# Patient Record
Sex: Male | Born: 1965 | Race: White | Hispanic: No | Marital: Married | Smoking: Never smoker
Health system: Southern US, Community
[De-identification: ages and names within clinical notes are randomized; demographics above are authoritative.]

## PROBLEM LIST (undated history)

## (undated) DIAGNOSIS — T63301A Toxic effect of unspecified spider venom, accidental (unintentional), initial encounter: Secondary | ICD-10-CM

## (undated) HISTORY — DX: Toxic effect of unspecified spider venom, accidental (unintentional), initial encounter: T63.301A

## (undated) HISTORY — PX: OTHER SURGICAL HISTORY: SHX169

## (undated) HISTORY — PX: FINGER SURGERY: SHX640

---

## 1998-06-19 ENCOUNTER — Encounter: Admission: RE | Admit: 1998-06-19 | Discharge: 1998-06-19 | Payer: Self-pay | Admitting: Sports Medicine

## 1999-03-08 ENCOUNTER — Encounter: Admission: RE | Admit: 1999-03-08 | Discharge: 1999-03-08 | Payer: Self-pay | Admitting: Sports Medicine

## 1999-05-04 ENCOUNTER — Encounter: Admission: RE | Admit: 1999-05-04 | Discharge: 1999-05-04 | Payer: Self-pay | Admitting: Sports Medicine

## 1999-06-04 ENCOUNTER — Encounter: Admission: RE | Admit: 1999-06-04 | Discharge: 1999-06-04 | Payer: Self-pay | Admitting: Family Medicine

## 1999-07-06 ENCOUNTER — Encounter: Admission: RE | Admit: 1999-07-06 | Discharge: 1999-07-06 | Payer: Self-pay | Admitting: Sports Medicine

## 2004-03-06 ENCOUNTER — Ambulatory Visit (HOSPITAL_COMMUNITY): Admission: RE | Admit: 2004-03-06 | Discharge: 2004-03-06 | Payer: Self-pay | Admitting: Family Medicine

## 2004-03-20 ENCOUNTER — Ambulatory Visit: Payer: Self-pay | Admitting: Sports Medicine

## 2004-05-01 ENCOUNTER — Ambulatory Visit: Payer: Self-pay | Admitting: Sports Medicine

## 2005-07-16 ENCOUNTER — Ambulatory Visit (HOSPITAL_COMMUNITY): Admission: RE | Admit: 2005-07-16 | Discharge: 2005-07-16 | Payer: Self-pay | Admitting: Sports Medicine

## 2006-06-27 ENCOUNTER — Emergency Department (HOSPITAL_COMMUNITY): Admission: EM | Admit: 2006-06-27 | Discharge: 2006-06-27 | Payer: Self-pay | Admitting: Emergency Medicine

## 2007-08-18 IMAGING — CR DG ANKLE COMPLETE 3+V*L*
3 series · 3 of 3 positions shown · non-contrast
Comparison: none

CLINICAL DATA: Ankle injury.  
 LEFT ANKLE - 3 VIEW:

[view not recorded (1 of 3)]
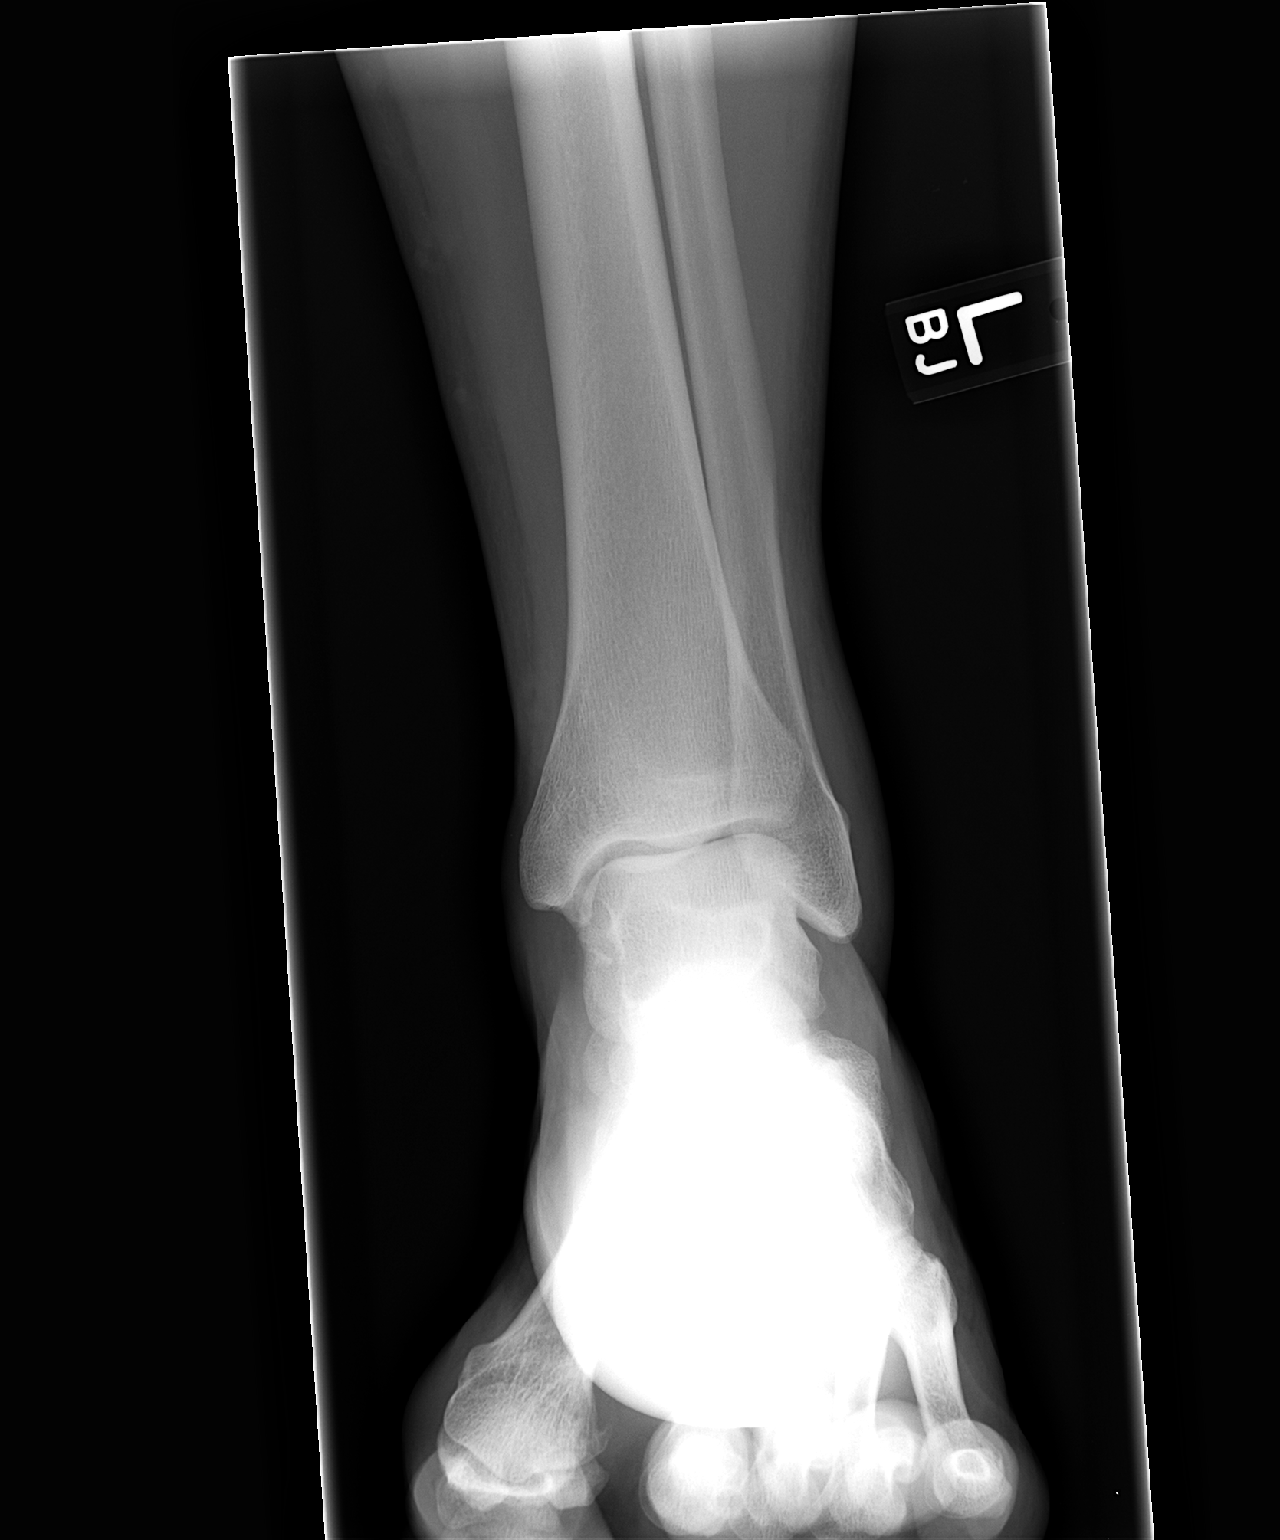

[view not recorded (2 of 3)]
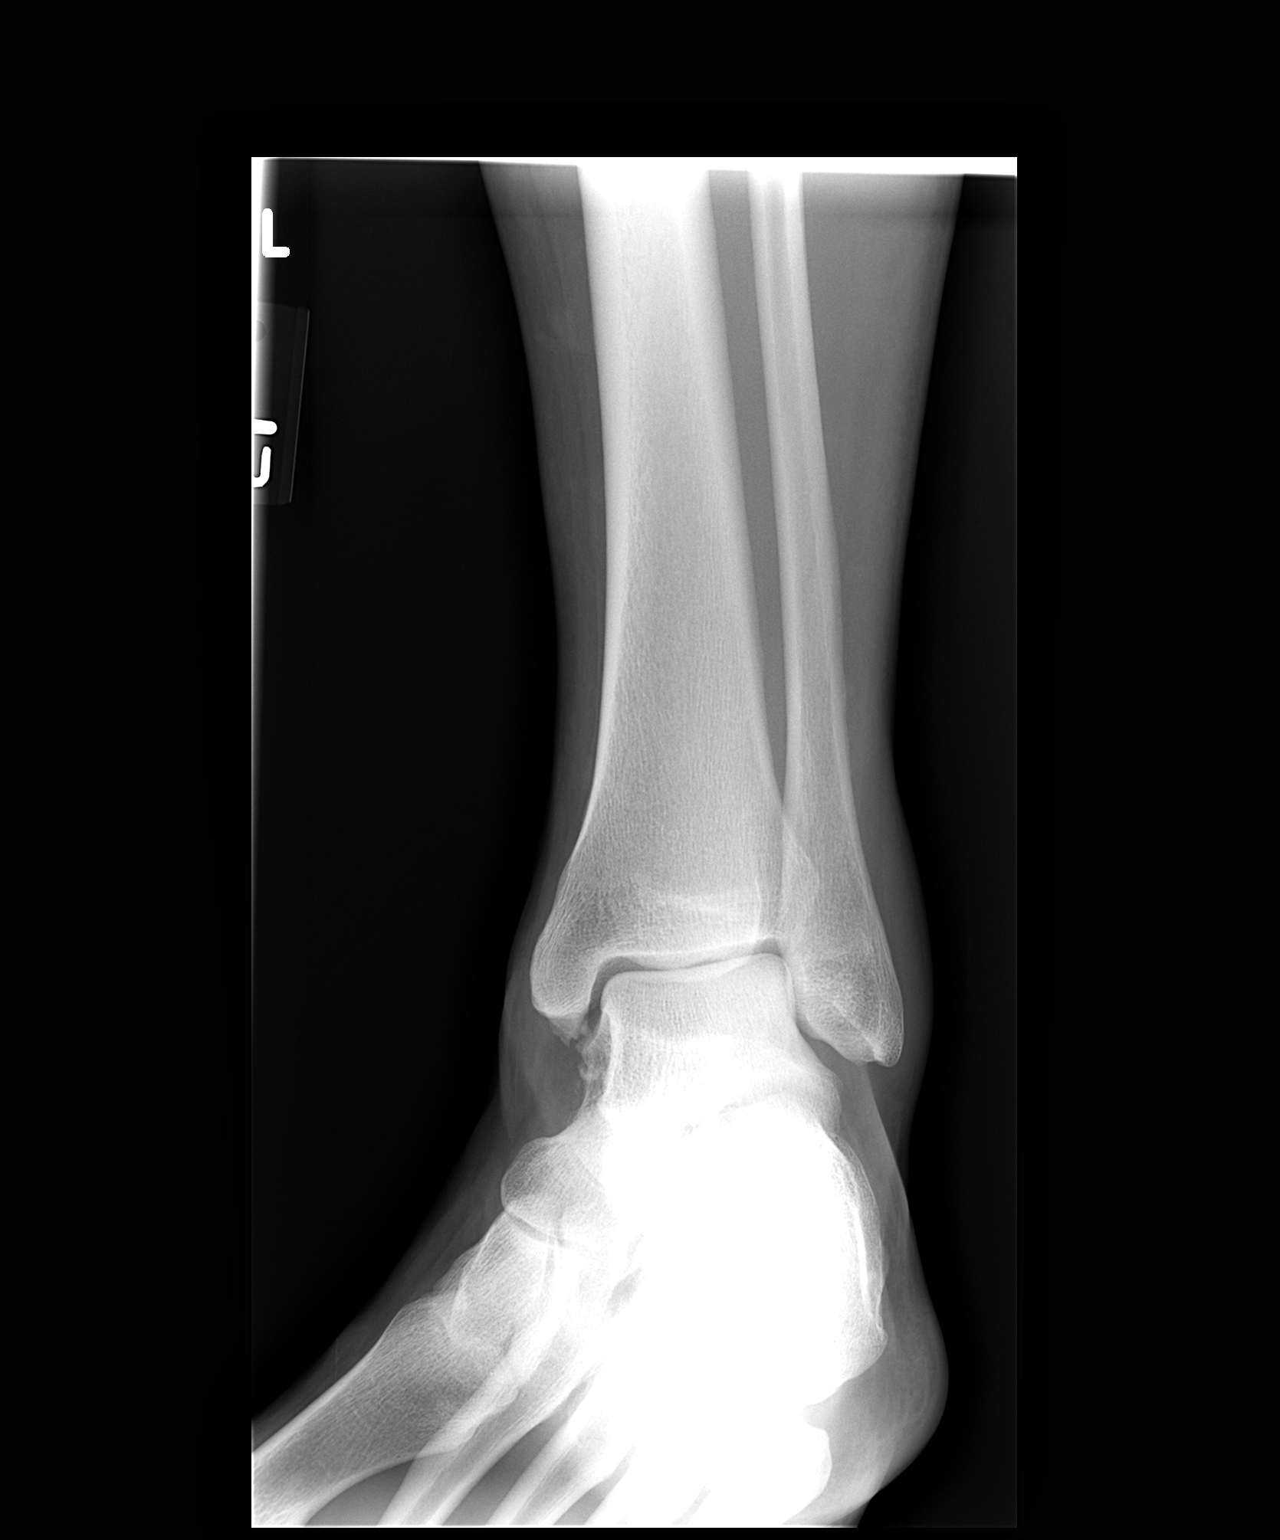

[view not recorded (3 of 3)]
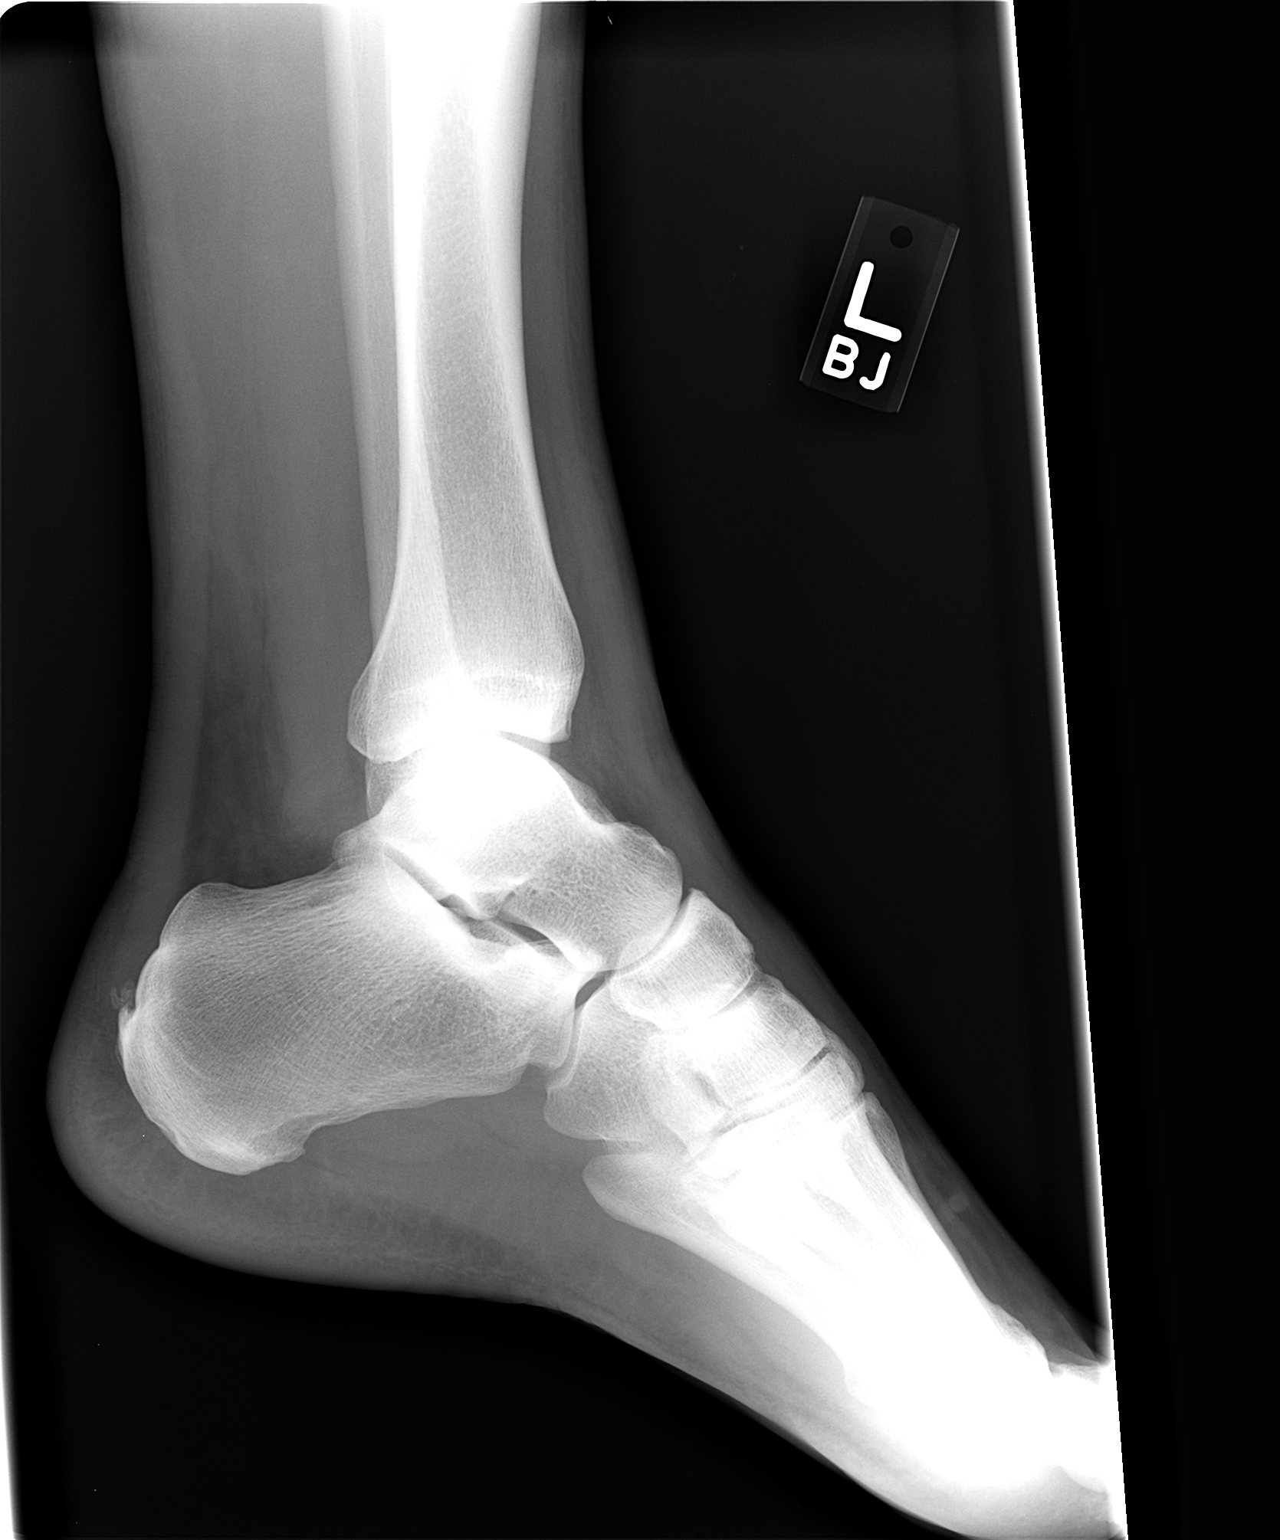

[3 of 3 positions shown; findings below may reference images not displayed]

FINDINGS: Normal alignment; no acute fracture.  There is some irregularity of the medial malleolus which appears to be due to an old fracture.  There is mild lateral soft tissue swelling.  There is early spurring of the calcaneus at the Achilles tendon insertion site.
IMPRESSION: Mild degenerative changes.  No acute abnormality.

## 2011-04-26 ENCOUNTER — Ambulatory Visit (INDEPENDENT_AMBULATORY_CARE_PROVIDER_SITE_OTHER): Payer: 59 | Admitting: Sports Medicine

## 2011-04-26 ENCOUNTER — Ambulatory Visit
Admission: RE | Admit: 2011-04-26 | Discharge: 2011-04-26 | Disposition: A | Discharge status: Home / Self Care | Payer: 59 | Source: Ambulatory Visit | Attending: Family Medicine | Admitting: Family Medicine

## 2011-04-26 VITALS — BP 144/90 | Ht 74.0 in | Wt 215.0 lb

## 2011-04-26 DIAGNOSIS — R1031 Right lower quadrant pain: Secondary | ICD-10-CM

## 2011-04-26 DIAGNOSIS — M76899 Other specified enthesopathies of unspecified lower limb, excluding foot: Secondary | ICD-10-CM

## 2011-04-26 DIAGNOSIS — M6580 Other synovitis and tenosynovitis, unspecified site: Secondary | ICD-10-CM

## 2011-04-26 MED ORDER — NITROGLYCERIN 0.2 MG/HR TD PT24: MEDICATED_PATCH | TRANSDERMAL | Status: DC

## 2011-04-26 NOTE — Progress Notes (Signed)
  Subjective:    Patient ID: Eric Richardson, male    DOB: 11/26/1965, 45 y.o.   MRN: [id number]  HPI Eric Richardson is a 45 yo male patient complaining of pain on the right groin area for the last 2 month after he ran a triathlon in 02/2011. He was able to run a 1/2 marathon last Sunday, he felt the pain after there race. He states that the pain is located in the right groin area at the level of the insertion of the abductor muscle in the pubis rami. The pain is constant, dull ache, 3/10 intensity, no radiated, worse with activities improve by rest. No numbness or tingling.  There is no problem list on file for this patient.  No current outpatient prescriptions on file prior to visit.   Allergies no known allergies   Review of Systems  Constitutional: Negative for chills, diaphoresis and fatigue.  Musculoskeletal: Negative for myalgias, back pain, joint swelling and gait problem.  Neurological: Negative for weakness and numbness.       Objective:   Physical Exam  Constitutional: He is oriented to person, place, and time. He appears well-developed and well-nourished.       BP 144/90  Ht 6\' 2"  (1.88 m)  Wt 215 lb (97.523 kg)  BMI 27.60 kg/m2   Pulmonary/Chest: Effort normal.  Abdominal: Soft. There is no tenderness.  Musculoskeletal:       R hip with FROM . No pian with ROM.  No TTP on the trochanteric bursa. No limping. TTP in the insertion of the abductor muscle in the pubis Sensation intact distally  No pain with resisted abduction   Neurological: He is alert and oriented to person, place, and time.  Skin: Skin is warm. No rash noted. No erythema. No pallor.  Psychiatric: He has a normal mood and affect. His behavior is normal.    MSK U/S : showed a hypoechoic area at the insertion of the abductor muscle in the pubis.       Assessment & Plan:   1. Right groin pain  DG Hip Complete Right, nitroGLYCERIN (NITRODUR - DOSED IN MG/24 HR) 0.2 mg/hr, DISCONTINUED: nitroGLYCERIN  (NITRODUR - DOSED IN MG/24 HR) 0.2 mg/hr  2. Adductor tendonitis     Nitro patch protocol Strengthening exercise for adductor muscle Avoid extreme stretches Cross fit training. F/U in 6 weeks.

## 2011-05-01 ENCOUNTER — Telehealth: Payer: Self-pay | Admitting: *Deleted

## 2011-05-01 NOTE — Telephone Encounter (Signed)
Message copied by Mora Bellman on Wed May 01, 2011 10:05 AM ------      Message from: Enid Baas      Created: Wed May 01, 2011  8:41 AM       Please let Eric Richardson know that his hip xray was normal.  He should stay on the same TX as Dr Ashley Jacobs suggested   tks

## 2011-05-01 NOTE — Telephone Encounter (Signed)
Called patient left VM to return my call.

## 2011-05-01 NOTE — Telephone Encounter (Signed)
Discussed results with pt.

## 2011-06-06 ENCOUNTER — Ambulatory Visit: Payer: 59 | Admitting: Sports Medicine

## 2011-07-04 ENCOUNTER — Ambulatory Visit: Payer: 59 | Admitting: Sports Medicine

## 2012-06-16 IMAGING — CR DG HIP COMPLETE 2+V*R*
2 series · 2 of 2 positions shown · non-contrast
Comparison: None.

CLINICAL DATA: Right groin pain.

RIGHT HIP - COMPLETE 2+ VIEW

[view not recorded (1 of 2)]
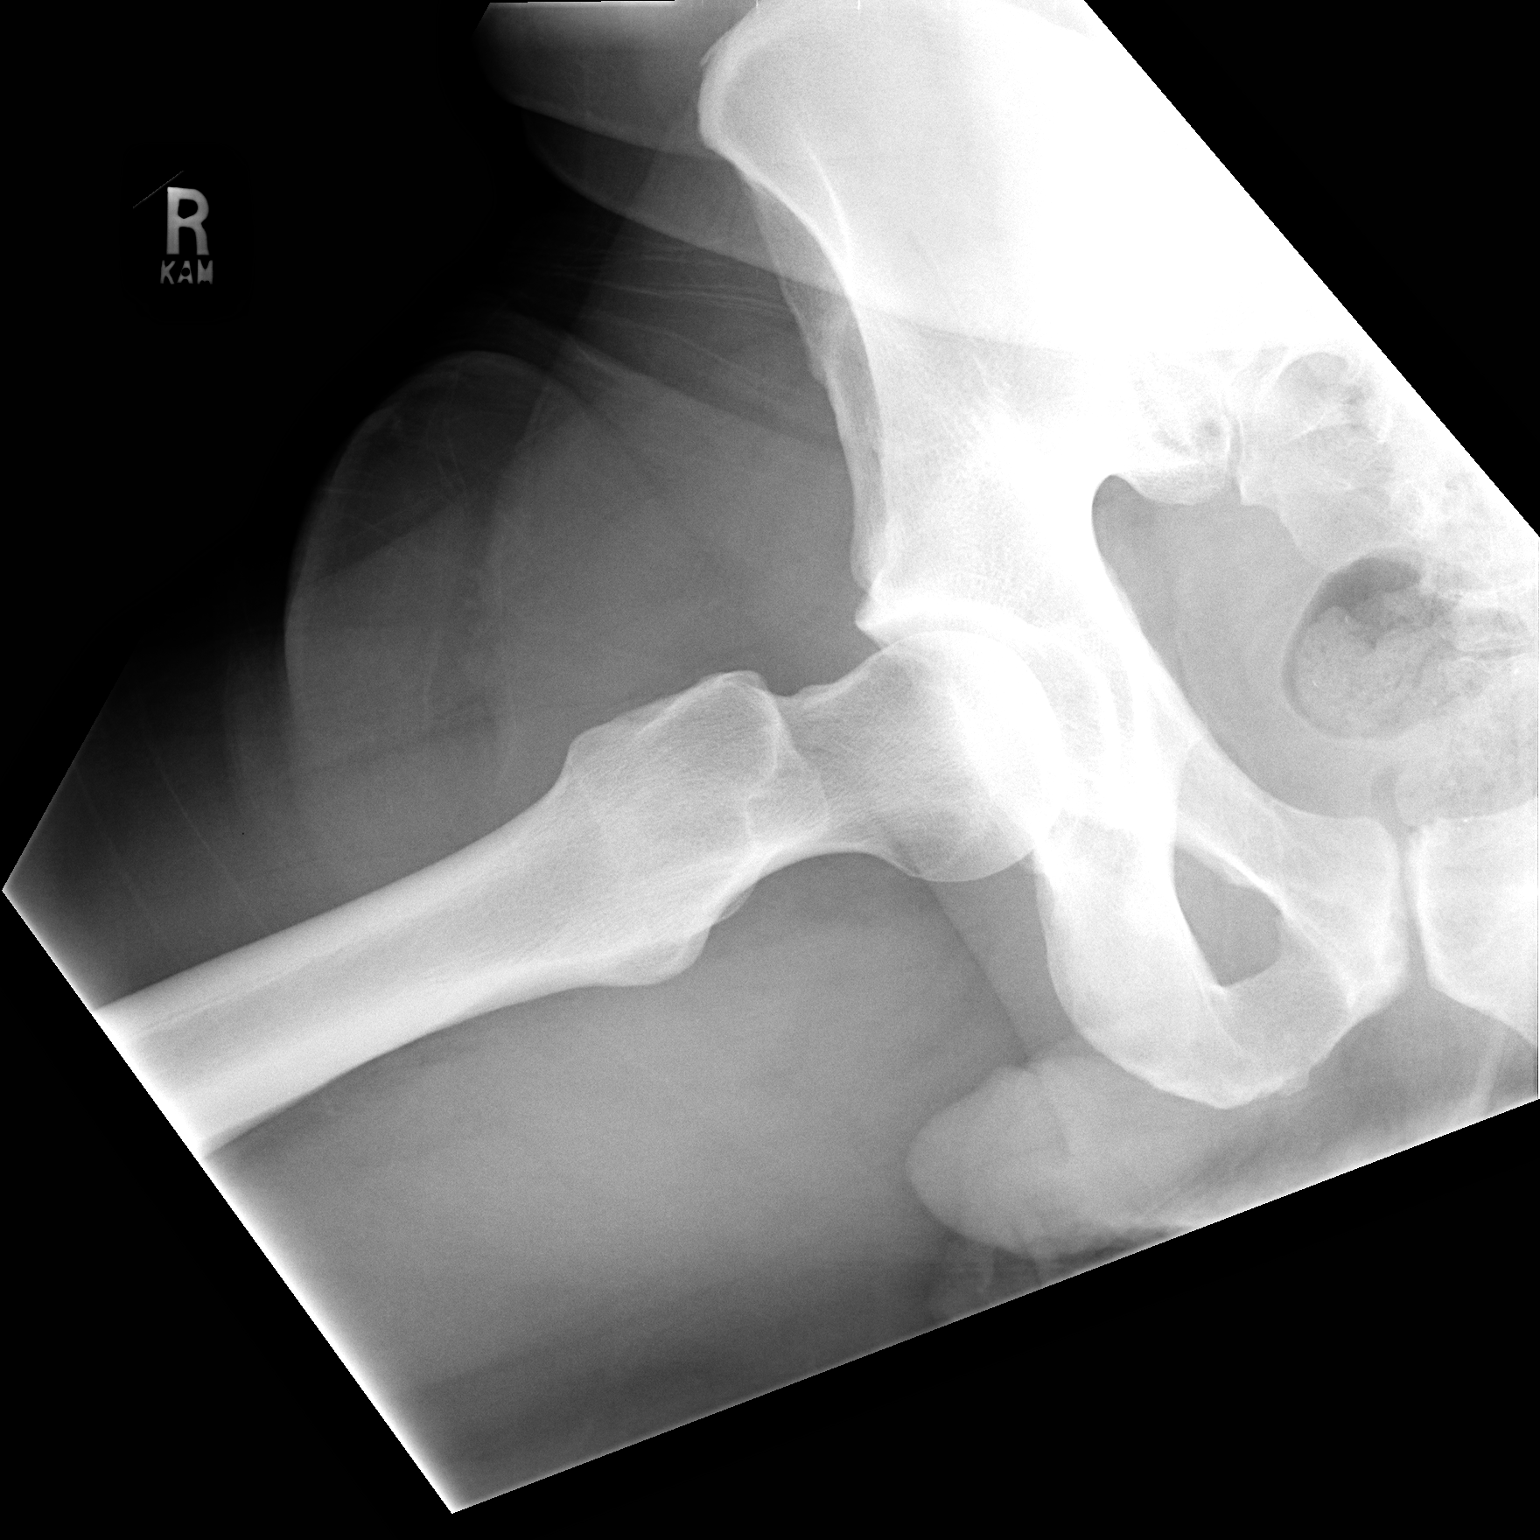

[view not recorded (2 of 2)]
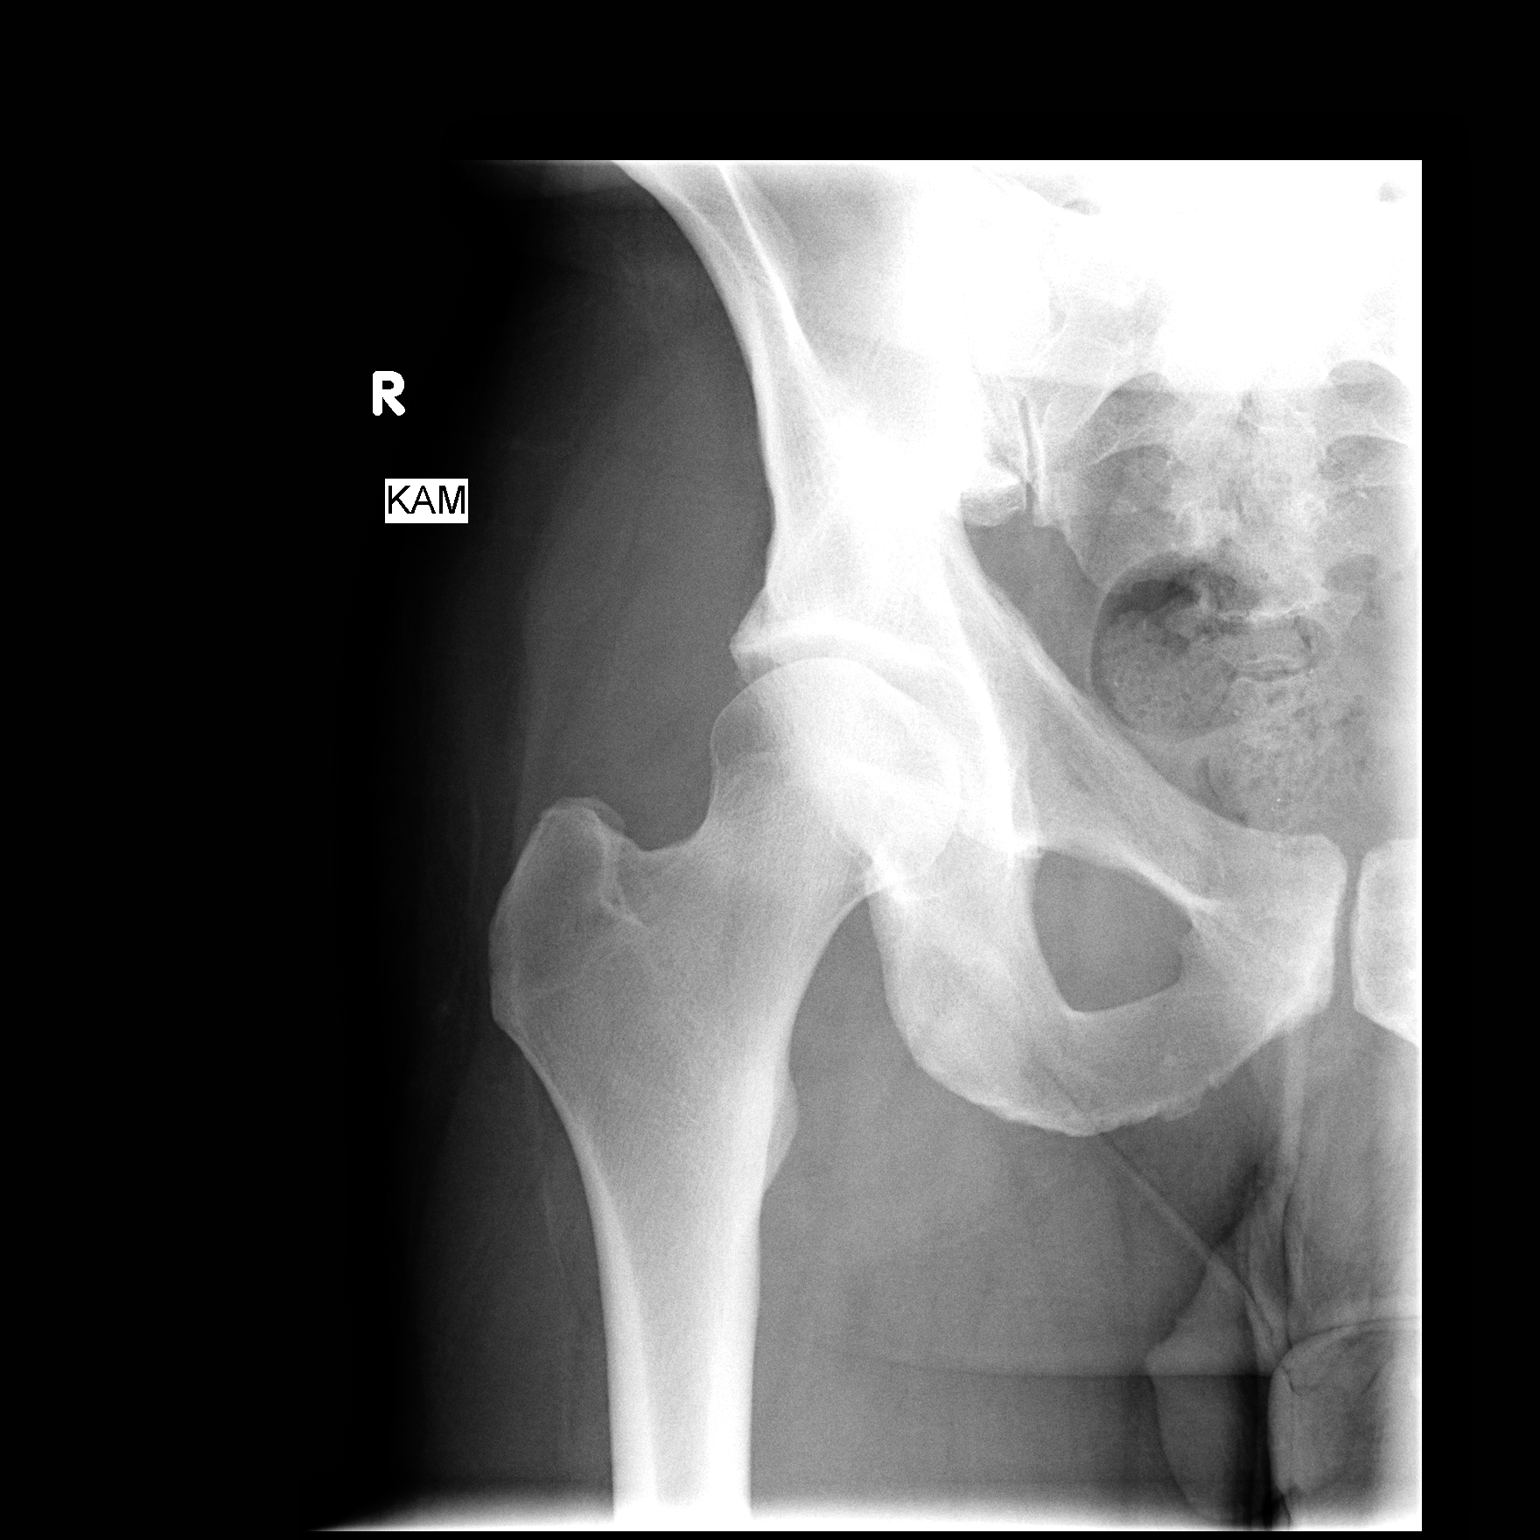

[2 of 2 positions shown; findings below may reference images not displayed]

FINDINGS: No acute bony abnormality.  Specifically, no fracture,
subluxation, or dislocation.  Soft tissues are intact.  Joint space
is maintained.  Right SI joint is unremarkable.
IMPRESSION: Normal study.

## 2013-07-26 ENCOUNTER — Ambulatory Visit: Payer: Self-pay | Admitting: Family Medicine

## 2013-08-03 ENCOUNTER — Ambulatory Visit: Payer: Self-pay | Admitting: Family Medicine

## 2015-05-10 ENCOUNTER — Telehealth: Payer: Self-pay | Admitting: Family Medicine

## 2015-05-10 DIAGNOSIS — Z79899 Other long term (current) drug therapy: Secondary | ICD-10-CM

## 2015-05-10 DIAGNOSIS — Z125 Encounter for screening for malignant neoplasm of prostate: Secondary | ICD-10-CM

## 2015-05-10 DIAGNOSIS — Z13220 Encounter for screening for lipoid disorders: Secondary | ICD-10-CM

## 2015-05-10 NOTE — Telephone Encounter (Signed)
Pt is needing lab orders sent over for his wellness visit in December. There are no previous labs in epic. Pt is also requesting a PSA.

## 2015-05-10 NOTE — Telephone Encounter (Signed)
Left message on voicemail notifying patient that blood work has been ordered.  

## 2015-05-10 NOTE — Telephone Encounter (Signed)
Lip liv m7 psa 

## 2015-06-07 LAB — PSA: Prostate Specific Ag, Serum: 1.8 ng/mL (ref 0.0–4.0)

## 2015-06-07 LAB — LIPID PANEL
CHOL/HDL RATIO: 5.2 ratio — AB (ref 0.0–5.0)
CHOLESTEROL TOTAL: 172 mg/dL (ref 100–199)
HDL: 33 mg/dL — ABNORMAL LOW (ref >39)
LDL CALC: 89 mg/dL (ref 0–99)
TRIGLYCERIDES: 252 mg/dL — AB (ref 0–149)
VLDL Cholesterol Cal: 50 mg/dL — ABNORMAL HIGH (ref 5–40)

## 2015-06-07 LAB — BASIC METABOLIC PANEL
BUN / CREAT RATIO: 12 (ref 9–20)
BUN: 12 mg/dL (ref 6–24)
CALCIUM: 9.4 mg/dL (ref 8.7–10.2)
CO2: 23 mmol/L (ref 18–29)
Chloride: 100 mmol/L (ref 96–106)
Creatinine, Ser: 0.99 mg/dL (ref 0.76–1.27)
GFR, EST AFRICAN AMERICAN: 103 mL/min/{1.73_m2} (ref >59)
GFR, EST NON AFRICAN AMERICAN: 89 mL/min/{1.73_m2} (ref >59)
Glucose: 114 mg/dL — ABNORMAL HIGH (ref 65–99)
Potassium: 4.5 mmol/L (ref 3.5–5.2)
Sodium: 139 mmol/L (ref 134–144)

## 2015-06-07 LAB — HEPATIC FUNCTION PANEL
ALBUMIN: 4.2 g/dL (ref 3.5–5.5)
ALT: 29 IU/L (ref 0–44)
AST: 23 IU/L (ref 0–40)
Alkaline Phosphatase: 47 IU/L (ref 39–117)
BILIRUBIN TOTAL: 0.2 mg/dL (ref 0.0–1.2)
BILIRUBIN, DIRECT: 0.08 mg/dL (ref 0.00–0.40)
Total Protein: 6.7 g/dL (ref 6.0–8.5)

## 2015-06-09 ENCOUNTER — Encounter: Payer: Self-pay | Admitting: Family Medicine

## 2015-06-09 ENCOUNTER — Ambulatory Visit (INDEPENDENT_AMBULATORY_CARE_PROVIDER_SITE_OTHER): Payer: BLUE CROSS/BLUE SHIELD | Admitting: Family Medicine

## 2015-06-09 VITALS — BP 148/86 | Ht 73.75 in | Wt 240.0 lb

## 2015-06-09 DIAGNOSIS — Z0000 Encounter for general adult medical examination without abnormal findings: Secondary | ICD-10-CM

## 2015-06-09 DIAGNOSIS — E785 Hyperlipidemia, unspecified: Secondary | ICD-10-CM | POA: Insufficient documentation

## 2015-06-09 DIAGNOSIS — R7301 Impaired fasting glucose: Secondary | ICD-10-CM | POA: Insufficient documentation

## 2015-06-09 DIAGNOSIS — Z0189 Encounter for other specified special examinations: Secondary | ICD-10-CM | POA: Diagnosis not present

## 2015-06-09 NOTE — Progress Notes (Signed)
Subjective:    Patient ID: Eric Richardson, male    DOB: [birth year], 49 y.o.   MRN: [id number]  HPI The patient comes in today for a wellness visit.  BP in aug was good ,  130 over 80  A review of their health history was completed.  A review of medications was also completed.  Any needed refills; not taking any meds  Eating habits: health concscious  Falls/  MVA accidents in past few months: none  Regular exercise: kettlebell, swim  Specialist pt sees on regular basis: none  Preventative health issues were discussed.   Additional concerns: none  Colonoscopy sheet given will turn 50 in February.   Results for orders placed or performed in visit on 05/10/15  Lipid panel  Result Value Ref Range   Cholesterol, Total 172 100 - 199 mg/dL   Triglycerides 696 (H) 0 - 149 mg/dL   HDL 33 (L) >29 mg/dL   VLDL Cholesterol Cal 50 (H) 5 - 40 mg/dL   LDL Calculated 89 0 - 99 mg/dL   Chol/HDL Ratio 5.2 (H) 0.0 - 5.0 ratio units  Hepatic function panel  Result Value Ref Range   Total Protein 6.7 6.0 - 8.5 g/dL   Albumin 4.2 3.5 - 5.5 g/dL   Bilirubin Total 0.2 0.0 - 1.2 mg/dL   Bilirubin, Direct 5.28 0.00 - 0.40 mg/dL   Alkaline Phosphatase 47 39 - 117 IU/L   AST 23 0 - 40 IU/L   ALT 29 0 - 44 IU/L  Basic metabolic panel  Result Value Ref Range   Glucose 114 (H) 65 - 99 mg/dL   BUN 12 6 - 24 mg/dL   Creatinine, Ser 4.13 0.76 - 1.27 mg/dL   GFR calc non Af Amer 89 >59 mL/min/1.73   GFR calc Af Amer 103 >59 mL/min/1.73   BUN/Creatinine Ratio 12 9 - 20   Sodium 139 134 - 144 mmol/L   Potassium 4.5 3.5 - 5.2 mmol/L   Chloride 100 96 - 106 mmol/L   CO2 23 18 - 29 mmol/L   Calcium 9.4 8.7 - 10.2 mg/dL  PSA  Result Value Ref Range   Prostate Specific Ag, Serum 1.8 0.0 - 4.0 ng/mL    Review of Systems  Constitutional: Negative for fever, activity change and appetite change.  HENT: Negative for congestion and rhinorrhea.   Eyes: Negative for discharge.  Respiratory: Negative  for cough and wheezing.   Cardiovascular: Negative for chest pain.  Gastrointestinal: Negative for vomiting, abdominal pain and blood in stool.  Genitourinary: Negative for frequency and difficulty urinating.  Musculoskeletal: Negative for neck pain.  Skin: Negative for rash.  Allergic/Immunologic: Negative for environmental allergies and food allergies.  Neurological: Negative for weakness and headaches.  Psychiatric/Behavioral: Negative for agitation.  All other systems reviewed and are negative.      Objective:   Physical Exam  Constitutional: He appears well-developed and well-nourished.  HENT:  Head: Normocephalic and atraumatic.  Right Ear: External ear normal.  Left Ear: External ear normal.  Nose: Nose normal.  Mouth/Throat: Oropharynx is clear and moist.  Eyes: EOM are normal. Pupils are equal, round, and reactive to light.  Neck: Normal range of motion. Neck supple. No thyromegaly present.  Cardiovascular: Normal rate, regular rhythm and normal heart sounds.   No murmur heard. Pulmonary/Chest: Effort normal and breath sounds normal. No respiratory distress. He has no wheezes.  Abdominal: Soft. Bowel sounds are normal. He exhibits no distension and no mass. There  is no tenderness.  Genitourinary: Penis normal.  Prostate within normal limits  Musculoskeletal: Normal range of motion. He exhibits no edema.  Lymphadenopathy:    He has no cervical adenopathy.  Neurological: He is alert. He exhibits normal muscle tone.  Skin: Skin is warm and dry. No erythema.  Psychiatric: He has a normal mood and affect. His behavior is normal. Judgment normal.  Vitals reviewed.         Assessment & Plan:  Impression 1 wellness exam #2 impaired fasting glucose discussed #3 hyperlipidemia discussed plan diet exercise discussed. No need for meds now. To get colonoscopy is next year. Recommend yearly checkups along with appropriate blood work.

## 2017-12-03 ENCOUNTER — Telehealth: Payer: Self-pay | Admitting: Family Medicine

## 2017-12-03 DIAGNOSIS — R7301 Impaired fasting glucose: Secondary | ICD-10-CM

## 2017-12-03 DIAGNOSIS — E785 Hyperlipidemia, unspecified: Secondary | ICD-10-CM

## 2017-12-03 DIAGNOSIS — Z79899 Other long term (current) drug therapy: Secondary | ICD-10-CM

## 2017-12-03 DIAGNOSIS — Z125 Encounter for screening for malignant neoplasm of prostate: Secondary | ICD-10-CM

## 2017-12-03 NOTE — Telephone Encounter (Signed)
Rep same 

## 2017-12-03 NOTE — Telephone Encounter (Signed)
Orders placed and the pt is aware. 

## 2017-12-03 NOTE — Telephone Encounter (Signed)
Patient needing labs done has wellness 7/16.

## 2017-12-03 NOTE — Telephone Encounter (Signed)
Last labs 06/06/15. Lipid, liver, bmp, psa

## 2017-12-25 LAB — LIPID PANEL
Chol/HDL Ratio: 4.4 ratio (ref 0.0–5.0)
Cholesterol, Total: 170 mg/dL (ref 100–199)
HDL: 39 mg/dL — AB (ref >39)
LDL Calculated: 113 mg/dL — ABNORMAL HIGH (ref 0–99)
TRIGLYCERIDES: 89 mg/dL (ref 0–149)
VLDL Cholesterol Cal: 18 mg/dL (ref 5–40)

## 2017-12-25 LAB — BASIC METABOLIC PANEL
BUN / CREAT RATIO: 9 (ref 9–20)
BUN: 10 mg/dL (ref 6–24)
CO2: 25 mmol/L (ref 20–29)
CREATININE: 1.14 mg/dL (ref 0.76–1.27)
Calcium: 9.4 mg/dL (ref 8.7–10.2)
Chloride: 102 mmol/L (ref 96–106)
GFR calc Af Amer: 85 mL/min/{1.73_m2} (ref >59)
GFR, EST NON AFRICAN AMERICAN: 74 mL/min/{1.73_m2} (ref >59)
GLUCOSE: 110 mg/dL — AB (ref 65–99)
Potassium: 4.8 mmol/L (ref 3.5–5.2)
SODIUM: 141 mmol/L (ref 134–144)

## 2017-12-25 LAB — HEPATIC FUNCTION PANEL
ALK PHOS: 40 IU/L (ref 39–117)
ALT: 25 IU/L (ref 0–44)
AST: 26 IU/L (ref 0–40)
Albumin: 4.3 g/dL (ref 3.5–5.5)
BILIRUBIN, DIRECT: 0.17 mg/dL (ref 0.00–0.40)
Bilirubin Total: 0.5 mg/dL (ref 0.0–1.2)
TOTAL PROTEIN: 7.2 g/dL (ref 6.0–8.5)

## 2017-12-25 LAB — PSA: PROSTATE SPECIFIC AG, SERUM: 0.6 ng/mL (ref 0.0–4.0)

## 2017-12-30 ENCOUNTER — Encounter: Payer: Self-pay | Admitting: Family Medicine

## 2017-12-30 ENCOUNTER — Ambulatory Visit (INDEPENDENT_AMBULATORY_CARE_PROVIDER_SITE_OTHER): Payer: Managed Care, Other (non HMO) | Admitting: Family Medicine

## 2017-12-30 VITALS — BP 144/96 | Ht 74.0 in | Wt 244.0 lb

## 2017-12-30 DIAGNOSIS — Z23 Encounter for immunization: Secondary | ICD-10-CM | POA: Diagnosis not present

## 2017-12-30 DIAGNOSIS — Z1211 Encounter for screening for malignant neoplasm of colon: Secondary | ICD-10-CM | POA: Diagnosis not present

## 2017-12-30 DIAGNOSIS — Z0000 Encounter for general adult medical examination without abnormal findings: Secondary | ICD-10-CM

## 2017-12-30 NOTE — Progress Notes (Signed)
Subjective:    Patient ID: Eric Richardson, male    DOB: [birth year], 52 y.o.   MRN: [id number]  HPI The patient comes in today for a wellness visit.    A review of their health history was completed.  A review of medications was also completed.  Any needed refills; none  Eating habits: pretty good, trying to increase veggies and cut down on sugar  Falls/  MVA accidents in past few months: no  Regular exercise: yes 3-5 times a week  Specialist pt sees on regular basis: no  Preventative health issues were discussed.   Additional concerns: no  Results for orders placed or performed in visit on 12/03/17  Lipid panel  Result Value Ref Range   Cholesterol, Total 170 100 - 199 mg/dL   Triglycerides 89 0 - 149 mg/dL   HDL 39 (L) >82>39 mg/dL   VLDL Cholesterol Cal 18 5 - 40 mg/dL   LDL Calculated 956113 (H) 0 - 99 mg/dL   Chol/HDL Ratio 4.4 0.0 - 5.0 ratio  Hepatic function panel  Result Value Ref Range   Total Protein 7.2 6.0 - 8.5 g/dL   Albumin 4.3 3.5 - 5.5 g/dL   Bilirubin Total 0.5 0.0 - 1.2 mg/dL   Bilirubin, Direct 2.130.17 0.00 - 0.40 mg/dL   Alkaline Phosphatase 40 39 - 117 IU/L   AST 26 0 - 40 IU/L   ALT 25 0 - 44 IU/L  Basic metabolic panel  Result Value Ref Range   Glucose 110 (H) 65 - 99 mg/dL   BUN 10 6 - 24 mg/dL   Creatinine, Ser 0.861.14 0.76 - 1.27 mg/dL   GFR calc non Af Amer 74 >59 mL/min/1.73   GFR calc Af Amer 85 >59 mL/min/1.73   BUN/Creatinine Ratio 9 9 - 20   Sodium 141 134 - 144 mmol/L   Potassium 4.8 3.5 - 5.2 mmol/L   Chloride 102 96 - 106 mmol/L   CO2 25 20 - 29 mmol/L   Calcium 9.4 8.7 - 10.2 mg/dL  PSA  Result Value Ref Range   Prostate Specific Ag, Serum 0.6 0.0 - 4.0 ng/mL   stillk exercising reg  Thought might have had a flare of shingles left lt flank one yr ago       Review of Systems  Constitutional: Negative for activity change, appetite change and fever.  HENT: Negative for congestion and rhinorrhea.   Eyes: Negative for  discharge.  Respiratory: Negative for cough and wheezing.   Cardiovascular: Negative for chest pain.  Gastrointestinal: Negative for abdominal pain, blood in stool and vomiting.  Genitourinary: Negative for difficulty urinating and frequency.  Musculoskeletal: Negative for neck pain.  Skin: Negative for rash.  Allergic/Immunologic: Negative for environmental allergies and food allergies.  Neurological: Negative for weakness and headaches.  Psychiatric/Behavioral: Negative for agitation.  All other systems reviewed and are negative.      Objective:   Physical Exam  Constitutional: He appears well-developed and well-nourished.  HENT:  Head: Normocephalic and atraumatic.  Right Ear: External ear normal.  Left Ear: External ear normal.  Nose: Nose normal.  Mouth/Throat: Oropharynx is clear and moist.  Eyes: Pupils are equal, round, and reactive to light. EOM are normal.  Neck: Normal range of motion. Neck supple. No thyromegaly present.  Cardiovascular: Normal rate, regular rhythm and normal heart sounds.  No murmur heard. Pulmonary/Chest: Effort normal and breath sounds normal. No respiratory distress. He has no wheezes.  Abdominal: Soft. Bowel sounds are  normal. He exhibits no distension and no mass. There is no tenderness.  Genitourinary: Prostate normal and penis normal.  Musculoskeletal: Normal range of motion. He exhibits no edema.  Lymphadenopathy:    He has no cervical adenopathy.  Neurological: He is alert. He exhibits normal muscle tone.  Skin: Skin is warm and dry. No erythema.  Psychiatric: He has a normal mood and affect. His behavior is normal. Judgment normal.          Assessment & Plan:  Impression well adult exam.  Diet discussed.  Exercise discussed.  Vaccines discussed and Tdap recommended.  Also GI referral for colonoscopy encouraged and we will help set up.  Patient's blood work discussed.  He now has prediabetes aspects of this discussed, also mild  hyperlipidemia not enough for medication also discussed follow-up yearly

## 2017-12-30 NOTE — Patient Instructions (Signed)
Results for orders placed or performed in visit on 12/03/17  Lipid panel  Result Value Ref Range   Cholesterol, Total 170 100 - 199 mg/dL   Triglycerides 89 0 - 149 mg/dL   HDL 39 (L) >40>39 mg/dL   VLDL Cholesterol Cal 18 5 - 40 mg/dL   LDL Calculated 981113 (H) 0 - 99 mg/dL   Chol/HDL Ratio 4.4 0.0 - 5.0 ratio  Hepatic function panel  Result Value Ref Range   Total Protein 7.2 6.0 - 8.5 g/dL   Albumin 4.3 3.5 - 5.5 g/dL   Bilirubin Total 0.5 0.0 - 1.2 mg/dL   Bilirubin, Direct 1.910.17 0.00 - 0.40 mg/dL   Alkaline Phosphatase 40 39 - 117 IU/L   AST 26 0 - 40 IU/L   ALT 25 0 - 44 IU/L  Basic metabolic panel  Result Value Ref Range   Glucose 110 (H) 65 - 99 mg/dL   BUN 10 6 - 24 mg/dL   Creatinine, Ser 4.781.14 0.76 - 1.27 mg/dL   GFR calc non Af Amer 74 >59 mL/min/1.73   GFR calc Af Amer 85 >59 mL/min/1.73   BUN/Creatinine Ratio 9 9 - 20   Sodium 141 134 - 144 mmol/L   Potassium 4.8 3.5 - 5.2 mmol/L   Chloride 102 96 - 106 mmol/L   CO2 25 20 - 29 mmol/L   Calcium 9.4 8.7 - 10.2 mg/dL  PSA  Result Value Ref Range   Prostate Specific Ag, Serum 0.6 0.0 - 4.0 ng/mL

## 2018-01-05 ENCOUNTER — Encounter (INDEPENDENT_AMBULATORY_CARE_PROVIDER_SITE_OTHER): Payer: Self-pay

## 2018-01-05 ENCOUNTER — Encounter: Payer: Self-pay | Admitting: Family Medicine

## 2018-01-05 ENCOUNTER — Encounter: Payer: Self-pay | Admitting: Gastroenterology

## 2018-02-18 ENCOUNTER — Other Ambulatory Visit: Payer: Self-pay

## 2018-02-18 ENCOUNTER — Ambulatory Visit (AMBULATORY_SURGERY_CENTER): Payer: Self-pay | Admitting: *Deleted

## 2018-02-18 VITALS — Ht 74.0 in | Wt 246.0 lb

## 2018-02-18 DIAGNOSIS — Z1211 Encounter for screening for malignant neoplasm of colon: Secondary | ICD-10-CM

## 2018-02-18 MED ORDER — SOD PICOSULFATE-MAG OX-CIT ACD 10-3.5-12 MG-GM -GM/160ML PO SOLN: 1.0000 | ORAL | 0 refills | Status: DC

## 2018-02-18 NOTE — Progress Notes (Signed)
No egg or soy allergy known to patient  No issues with past sedation with any surgeries  or procedures, no intubation problems  No diet pills per patient No home 02 use per patient  No blood thinners per patient  Pt denies issues with constipation  No A fib or A flutter  EMMI video sent to pt's e mail  

## 2018-02-24 ENCOUNTER — Encounter: Payer: Self-pay | Admitting: Gastroenterology

## 2018-03-09 ENCOUNTER — Encounter: Payer: Self-pay | Admitting: Gastroenterology

## 2018-03-09 ENCOUNTER — Other Ambulatory Visit: Payer: Self-pay

## 2018-03-09 ENCOUNTER — Ambulatory Visit (AMBULATORY_SURGERY_CENTER): Payer: Managed Care, Other (non HMO) | Admitting: Gastroenterology

## 2018-03-09 VITALS — BP 129/75 | HR 63 | Temp 99.1°F | Resp 20 | Ht 74.0 in | Wt 244.0 lb

## 2018-03-09 DIAGNOSIS — Z1211 Encounter for screening for malignant neoplasm of colon: Secondary | ICD-10-CM

## 2018-03-09 MED ORDER — SODIUM CHLORIDE 0.9 % IV SOLN: 500.0000 mL | Freq: Once | INTRAVENOUS | Status: DC

## 2018-03-09 NOTE — Patient Instructions (Signed)
Impression/Recommendations:  Diverticulosis handout given to patient.  Resume previous diet. Continue present medications.  Repeat colonoscopy in 10 years for screening purposes.  Return to GI clinic as needed.  YOU HAD AN ENDOSCOPIC PROCEDURE TODAY AT THE [facility]:   Refer to the procedure report that was given to you for any specific questions about what was found during the examination.  If the procedure report does not answer your questions, please call your gastroenterologist to clarify.  If you requested that your care partner not be given the details of your procedure findings, then the procedure report has been included in a sealed envelope for you to review at your convenience later.  YOU SHOULD EXPECT: Some feelings of bloating in the abdomen. Passage of more gas than usual.  Walking can help get rid of the air that was put into your GI tract during the procedure and reduce the bloating. If you had a lower endoscopy (such as a colonoscopy or flexible sigmoidoscopy) you may notice spotting of blood in your stool or on the toilet paper. If you underwent a bowel prep for your procedure, you may not have a normal bowel movement for a few days.  Please Note:  You might notice some irritation and congestion in your nose or some drainage.  This is from the oxygen used during your procedure.  There is no need for concern and it should clear up in a day or so.  SYMPTOMS TO REPORT IMMEDIATELY:   Following lower endoscopy (colonoscopy or flexible sigmoidoscopy):  Excessive amounts of blood in the stool  Significant tenderness or worsening of abdominal pains  Swelling of the abdomen that is new, acute  Fever of 100F or higher  For urgent or emergent issues, a gastroenterologist can be reached at any hour by calling [phone].   DIET:  We do recommend a small meal at first, but then you may proceed to your regular diet.  Drink plenty of fluids but you should avoid  alcoholic beverages for 24 hours.  ACTIVITY:  You should plan to take it easy for the rest of today and you should NOT DRIVE or use heavy machinery until tomorrow (because of the sedation medicines used during the test).    FOLLOW UP: Our staff will call the number listed on your records the next business day following your procedure to check on you and address any questions or concerns that you may have regarding the information given to you following your procedure. If we do not reach you, we will leave a message.  However, if you are feeling well and you are not experiencing any problems, there is no need to return our call.  We will assume that you have returned to your regular daily activities without incident.  If any biopsies were taken you will be contacted by phone or by letter within the next 1-3 weeks.  Please call us at [phone] if you have not heard about the biopsies in 3 weeks.    SIGNATURES/CONFIDENTIALITY: You and/or your care partner have signed paperwork which will be entered into your electronic medical record.  These signatures attest to the fact that that the information above on your After Visit Summary has been reviewed and is understood.  Full responsibility of the confidentiality of this discharge information lies with you and/or your care-partner.

## 2018-03-09 NOTE — Addendum Note (Signed)
Addended by: Vernell BarrierSECHLER, Ziere Docken M on: 03/09/2018 12:20 PM   Modules accepted: Orders

## 2018-03-09 NOTE — Progress Notes (Signed)
Pt's states no medical or surgical changes since previsit or office visit. 

## 2018-03-09 NOTE — Op Note (Signed)
[facility] Patient Name: Oseias Horsey Procedure Date: 03/09/2018 10:30 AM MRN: [id number] Endoscopist: Lynann Bologna , MD Age: 52 Referring MD:  Date of Birth: 1965/10/30 Gender: Male Account #: [id number] Procedure:                Colonoscopy Indications:              Screening for colorectal malignant neoplasm Medicines:                Monitored Anesthesia Care Procedure:                Pre-Anesthesia Assessment:                           - Prior to the procedure, a History and Physical                            was performed, and patient medications and                            allergies were reviewed. The patient's tolerance of                            previous anesthesia was also reviewed. The risks                            and benefits of the procedure and the sedation                            options and risks were discussed with the patient.                            All questions were answered, and informed consent                            was obtained. Prior Anticoagulants: The patient has                            taken no previous anticoagulant or antiplatelet                            agents. ASA Grade Assessment: I - A normal, healthy                            patient. After reviewing the risks and benefits,                            the patient was deemed in satisfactory condition to                            undergo the procedure.                           After obtaining informed consent, the colonoscope  was passed under direct vision. Throughout the                            procedure, the patient's blood pressure, pulse, and                            oxygen saturations were monitored continuously. The                            Colonoscope was introduced through the anus and                            advanced to the 2 cm into the ileum. The                            colonoscopy was performed without  difficulty. The                            patient tolerated the procedure well. The quality                            of the bowel preparation was good. Scope In: 10:34:22 AM Scope Out: 10:48:04 AM Scope Withdrawal Time: 0 hours 9 minutes 58 seconds  Total Procedure Duration: 0 hours 13 minutes 42 seconds  Findings:                 A few small-mouthed diverticula were found in the                            sigmoid colon.                           The exam was otherwise without abnormality on                            direct and retroflexion views. Complications:            No immediate complications. Estimated Blood Loss:     Estimated blood loss: none. Impression:               - Minimal sigmoid diverticulosis.                           - Otherwise normal colonoscopy to TI. Recommendation:           - Patient has a contact number available for                            emergencies. The signs and symptoms of potential                            delayed complications were discussed with the                            patient. Return to normal activities tomorrow.  Written discharge instructions were provided to the                            patient.                           - Resume previous diet.                           - Continue present medications.                           - Repeat colonoscopy in 10 years for screening                            purposes. Earlier, if with any new problems or any                            change in family history.                           - Return to GI clinic PRN. Lynann Bolognaajesh Hari Casaus, MD 03/09/2018 10:52:25 AM This report has been signed electronically.

## 2018-03-09 NOTE — Progress Notes (Signed)
Report given to PACU, vss 

## 2018-03-10 ENCOUNTER — Telehealth: Payer: Self-pay | Admitting: *Deleted

## 2018-03-10 NOTE — Telephone Encounter (Signed)
  Follow up Call-  Call back number 03/09/2018  Post procedure Call Back phone  # [phone]  Permission to leave phone message Yes  Some recent data might be hidden     Patient questions:  Do you have a fever, pain , or abdominal swelling? No. Pain Score  0 *  Have you tolerated food without any problems? Yes.    Have you been able to return to your normal activities? Yes.    Do you have any questions about your discharge instructions: Diet   No. Medications  No. Follow up visit  No.  Do you have questions or concerns about your Care? No.  Actions: * If pain score is 4 or above: No action needed, pain <4.

## 2019-05-28 ENCOUNTER — Ambulatory Visit (INDEPENDENT_AMBULATORY_CARE_PROVIDER_SITE_OTHER): Payer: Managed Care, Other (non HMO) | Admitting: Family Medicine

## 2019-05-28 ENCOUNTER — Other Ambulatory Visit: Payer: Self-pay

## 2019-05-28 DIAGNOSIS — Z20828 Contact with and (suspected) exposure to other viral communicable diseases: Secondary | ICD-10-CM | POA: Diagnosis not present

## 2019-05-28 DIAGNOSIS — Z20822 Contact with and (suspected) exposure to covid-19: Secondary | ICD-10-CM

## 2019-05-28 NOTE — Progress Notes (Signed)
   Subjective:    Patient ID: Eric Richardson, male    DOB: [birth year], 53 y.o.   MRN: [id number]  HPI Pt was exposed to a positive COVID person yesterday. He was in a meeting with someone that had been in contact with a positive person on Saturday. Pt is not having any symptoms.   Virtual Visit via Telephone Note  I connected with Eric Richardson on 05/28/19 at  3:00 PM EST by telephone and verified that I am speaking with the correct person using two identifiers.  Location: Patient: home Provider: office   I discussed the limitations, risks, security and privacy concerns of performing an evaluation and management service by telephone and the availability of in person appointments. I also discussed with the patient that there may be a patient responsible charge related to this service. The patient expressed understanding and agreed to proceed.   History of Present Illness:    Observations/Objective:   Assessment and Plan:   Follow Up Instructions:    I discussed the assessment and treatment plan with the patient. The patient was provided an opportunity to ask questions and all were answered. The patient agreed with the plan and demonstrated an understanding of the instructions.   The patient was advised to call back or seek an in-person evaluation if the symptoms worsen or if the condition fails to improve as anticipated.  I provided 17 minutes of non-face-to-face time during this encounter.   Vicente Males, LPN   Review of Systems No headache, no major weight loss or weight gain, no chest pain no back pain abdominal pain no change in bowel habits complete ROS otherwise negative     Objective:   Physical Exam  Virtual      Assessment & Plan:  Impression close COVID-19 exposure.  For greater than 15 minutes within close to room.  Warning signs discussed.  Covid testing limitations discussed.  Precautions discussed.  Patient to go get testing Monday at [facility] which will be  day 5 since exposure

## 2019-05-30 ENCOUNTER — Encounter: Payer: Self-pay | Admitting: Family Medicine

## 2019-06-04 ENCOUNTER — Other Ambulatory Visit: Payer: Self-pay

## 2019-07-21 ENCOUNTER — Encounter: Payer: Self-pay | Admitting: Family Medicine

## 2019-08-21 ENCOUNTER — Ambulatory Visit: Payer: Managed Care, Other (non HMO) | Attending: Internal Medicine

## 2019-08-21 DIAGNOSIS — Z23 Encounter for immunization: Secondary | ICD-10-CM | POA: Insufficient documentation

## 2019-08-21 NOTE — Progress Notes (Signed)
   Covid-19 Vaccination Clinic  Name:  Eric Richardson    MRN: [id number] DOB: May 27, 1966  08/21/2019  Mr. Eric Richardson was observed post Covid-19 immunization for 15 minutes without incident. He was provided with Vaccine Information Sheet and instruction to access the V-Safe system.   Mr. Eric Richardson was instructed to call 911 with any severe reactions post vaccine: Marland Kitchen Difficulty breathing  . Swelling of face and throat  . A fast heartbeat  . A bad rash all over body  . Dizziness and weakness   Immunizations Administered    Name Date Dose VIS Date Route   Pfizer COVID-19 Vaccine 08/21/2019  2:30 PM 0.3 mL 05/28/2019 Intramuscular   Manufacturer: [employer], [employer]   Lot: [id number]   NDC: [address]-4

## 2019-09-11 ENCOUNTER — Ambulatory Visit: Payer: Managed Care, Other (non HMO) | Attending: Internal Medicine

## 2019-09-11 DIAGNOSIS — Z23 Encounter for immunization: Secondary | ICD-10-CM

## 2019-09-11 NOTE — Progress Notes (Signed)
   Covid-19 Vaccination Clinic  Name:  Eric Richardson    MRN: [id number] DOB: 1965/11/28  09/11/2019  [name] was observed post Covid-19 immunization for 15 minutes without incident. He was provided with Vaccine Information Sheet and instruction to access the V-Safe system.   [name] was instructed to call 911 with any severe reactions post vaccine: Marland Kitchen Difficulty breathing  . Swelling of face and throat  . A fast heartbeat  . A bad rash all over body  . Dizziness and weakness   Immunizations Administered    Name Date Dose VIS Date Route   Pfizer COVID-19 Vaccine 09/11/2019  3:00 PM 0.3 mL 05/28/2019 Intramuscular   Manufacturer: [employer], [employer]   Lot: [id number]   NDC: [address]-1

## 2019-09-20 ENCOUNTER — Ambulatory Visit: Payer: Managed Care, Other (non HMO)

## 2019-09-21 ENCOUNTER — Ambulatory Visit: Payer: Managed Care, Other (non HMO)

## 2019-12-07 ENCOUNTER — Ambulatory Visit: Payer: Managed Care, Other (non HMO)

## 2019-12-07 ENCOUNTER — Encounter: Payer: Self-pay | Admitting: Orthopaedic Surgery

## 2019-12-07 ENCOUNTER — Other Ambulatory Visit: Payer: Self-pay

## 2019-12-07 ENCOUNTER — Ambulatory Visit (INDEPENDENT_AMBULATORY_CARE_PROVIDER_SITE_OTHER): Payer: Managed Care, Other (non HMO) | Admitting: Orthopaedic Surgery

## 2019-12-07 VITALS — BP 160/98 | HR 63 | Ht 74.0 in | Wt 240.0 lb

## 2019-12-07 DIAGNOSIS — M25561 Pain in right knee: Secondary | ICD-10-CM

## 2019-12-07 NOTE — Progress Notes (Signed)
Subjective:    Patient ID: Eric Richardson, male    DOB: 01-13-1966, 54 y.o.   MRN: [id number]  HPI He has history of right knee pain over the years since an injury in the [place] in 1995.  He was doing wall jumps yesterday and felt pain lateral right knee.  He has had swelling and popping.  At first he could not fully extend but has been able to do so today.  He has no giving way.  He has no popping.  He has no redness.  He has no other joint pains.   Review of Systems  Constitutional: Positive for activity change.  Musculoskeletal: Positive for arthralgias, gait problem and joint swelling.  All other systems reviewed and are negative.  For Review of Systems, all other systems reviewed and are negative.  The following is a summary of the past history medically, past history surgically, known current medicines, social history and family history.  This information is gathered electronically by the computer from prior information and documentation.  I review this each visit and have found including this information at this point in the chart is beneficial and informative.   Past Medical History:  Diagnosis Date   Spider bite     Past Surgical History:  Procedure Laterality Date   FINGER SURGERY     I and D of spider bite      Current Outpatient Medications on File Prior to Visit  Medication Sig Dispense Refill   ibuprofen (ADVIL,MOTRIN) 200 MG tablet Take 200 mg by mouth every 6 (six) hours as needed. (Patient not taking: Reported on 12/07/2019)     No current facility-administered medications on file prior to visit.    Social History   Socioeconomic History   Marital status: Married    Spouse name: Not on file   Number of children: Not on file   Years of education: Not on file   Highest education level: Not on file  Occupational History   Not on file  Tobacco Use   Smoking status: Never Smoker   Smokeless tobacco: Never Used  Vaping Use   Vaping Use: Never  used  Substance and Sexual Activity   Alcohol use: Yes    Alcohol/week: 6.0 standard drinks    Types: 6 Cans of beer per week   Drug use: Never   Sexual activity: Not on file  Other Topics Concern   Not on file  Social History Narrative   Not on file   Social Determinants of Health   Financial Resource Strain:    Difficulty of Paying Living Expenses:   Food Insecurity:    Worried About Charity fundraiser in the Last Year:    Arboriculturist in the Last Year:   Transportation Needs:    Film/video editor (Medical):    Lack of Transportation (Non-Medical):   Physical Activity:    Days of Exercise per Week:    Minutes of Exercise per Session:   Stress:    Feeling of Stress :   Social Connections:    Frequency of Communication with Friends and Family:    Frequency of Social Gatherings with Friends and Family:    Attends Religious Services:    Active Member of Clubs or Organizations:    Attends Archivist Meetings:    Marital Status:   Intimate Partner Violence:    Fear of Current or Ex-Partner:    Emotionally Abused:    Physically Abused:  Sexually Abused:     Family History  Problem Relation Age of Onset   Colon cancer Paternal Uncle    Diabetes Father    Heart disease Father    Colon polyps Neg Hx    Esophageal cancer Neg Hx    Stomach cancer Neg Hx    Rectal cancer Neg Hx     BP (!) 160/98    Pulse 63    Ht 6\' 2"  (1.88 m)    Wt 240 lb (108.9 kg)    BMI 30.81 kg/m   Body mass index is 30.81 kg/m.     Objective:   Physical Exam Vitals and nursing note reviewed.  Constitutional:      Appearance: He is well-developed.  HENT:     Head: Normocephalic and atraumatic.  Eyes:     Conjunctiva/sclera: Conjunctivae normal.     Pupils: Pupils are equal, round, and reactive to light.  Cardiovascular:     Rate and Rhythm: Normal rate and regular rhythm.  Pulmonary:     Effort: Pulmonary effort is normal.    Abdominal:     Palpations: Abdomen is soft.  Musculoskeletal:     Cervical back: Normal range of motion and neck supple.       Legs:  Skin:    General: Skin is warm and dry.  Neurological:     Mental Status: He is alert and oriented to person, place, and time.     Cranial Nerves: No cranial nerve deficit.     Motor: No abnormal muscle tone.     Coordination: Coordination normal.     Deep Tendon Reflexes: Reflexes are normal and symmetric. Reflexes normal.  Psychiatric:        Behavior: Behavior normal.        Thought Content: Thought content normal.        Judgment: Judgment normal.    X-rays were done of the right knee, reported separately.        Assessment & Plan:   Encounter Diagnosis  Name Primary?   Acute pain of right knee Yes   I am concerned about possible meniscus tear laterally as he has had problems extending and the lateral pain.  I will have him take ibuprofen 600 mgm tid  Return in two weeks.  He may need MRI or PT or both.  Call if any problem.  Precautions discussed.   Electronically Signed , MD 6/22/20212:02 PM

## 2019-12-21 ENCOUNTER — Ambulatory Visit: Payer: Managed Care, Other (non HMO) | Admitting: Orthopaedic Surgery

## 2020-03-22 ENCOUNTER — Encounter: Payer: Self-pay | Admitting: Family Medicine

## 2020-03-22 ENCOUNTER — Other Ambulatory Visit: Payer: Self-pay

## 2020-03-22 ENCOUNTER — Telehealth (INDEPENDENT_AMBULATORY_CARE_PROVIDER_SITE_OTHER): Payer: Managed Care, Other (non HMO) | Admitting: Family Medicine

## 2020-03-22 DIAGNOSIS — A09 Infectious gastroenteritis and colitis, unspecified: Secondary | ICD-10-CM | POA: Diagnosis not present

## 2020-03-22 MED ORDER — CIPROFLOXACIN HCL 500 MG PO TABS: 500.0000 mg | ORAL_TABLET | Freq: Two times a day (BID) | ORAL | 0 refills | Status: DC

## 2020-03-22 NOTE — Progress Notes (Signed)
   Patient ID: Eric Richardson, male    DOB: [birth year], 54 y.o.   MRN: [id number]  Virtual Visit via Video Note  I connected with Irven Coe on 03/22/20 at  3:30 PM EDT by a video enabled telemedicine application and verified that I am speaking with the correct person using two identifiers.  Location: Patient: home Provider: home   I discussed the limitations of evaluation and management by telemedicine and the availability of in person appointments. The patient expressed understanding and agreed to proceed. Chief Complaint  Patient presents with  . Diarrhea    for 3 days- now noticing light pink in toilet- thinks he has food poisioning   Subjective:    HPI Pt having some diarrhea, 2 days ago with cramping. Fever yesterday afternoon. 103F, yesterday. And normal temp today. Cramping in abdomen is intermittent. Had whelps on arms and legs. Wife google and thought could be Camplobacter. Just got back from [place] and last week was negative for covid last week. Had covid before and doesn't feel like covid illness. Noticing some "blood" in stool, pink tinge to it.  Has been drinking red powerade. Only eating crackers and toast. Drinking Water. Took benadryl since last night help with itchiness. whelps are improving. Tylenol. Bout of diarrhea today- 7am had 4. No immodium.   Medical History Dequavius has a past medical history of Spider bite.   Outpatient Encounter Medications as of 03/22/2020  Medication Sig  . ciprofloxacin (CIPRO) 500 MG tablet Take 1 tablet (500 mg total) by mouth 2 (two) times daily.  Marland Kitchen ibuprofen (ADVIL,MOTRIN) 200 MG tablet Take 200 mg by mouth every 6 (six) hours as needed. (Patient not taking: Reported on 12/07/2019)   No facility-administered encounter medications on file as of 03/22/2020.     Review of Systems  Constitutional: Negative for chills and fever.  HENT: Negative for congestion, rhinorrhea and sore throat.   Respiratory: Negative for  cough, shortness of breath and wheezing.   Cardiovascular: Negative for chest pain and leg swelling.  Gastrointestinal: Positive for abdominal pain and diarrhea. Negative for nausea and vomiting.  Genitourinary: Negative for dysuria and frequency.  Skin: Positive for rash.  Neurological: Negative for dizziness, weakness and headaches.     Vitals There were no vitals taken for this visit.  Objective:   Physical Exam Video call, NAD.  Assessment and Plan   1. Infectious diarrhea - ciprofloxacin (CIPRO) 500 MG tablet; Take 1 tablet (500 mg total) by mouth 2 (two) times daily.  Dispense: 14 tablet; Refill: 0    Will treat empirically with cipro 500mg  bid for 7 days. immodium or pepto-bismol prn. Increase fluids. If bright red blood or black stools to call back and if more pain/fever in abd then call or rto.  F/u prn.   Follow Up Instructions:    I discussed the assessment and treatment plan with the patient. The patient was provided an opportunity to ask questions and all were answered. The patient agreed with the plan and demonstrated an understanding of the instructions.   The patient was advised to call back or seek an in-person evaluation if the symptoms worsen or if the condition fails to improve as anticipated.  I provided 12 minutes of non-face-to-face time during this encounter.   Rakia Frayne [place], DO

## 2021-02-06 ENCOUNTER — Ambulatory Visit (INDEPENDENT_AMBULATORY_CARE_PROVIDER_SITE_OTHER): Payer: Managed Care, Other (non HMO) | Admitting: Internal Medicine

## 2021-05-08 ENCOUNTER — Ambulatory Visit: Payer: Managed Care, Other (non HMO) | Admitting: Registered Nurse

## 2021-06-25 ENCOUNTER — Encounter: Payer: Self-pay | Admitting: Registered Nurse

## 2021-06-25 ENCOUNTER — Ambulatory Visit (INDEPENDENT_AMBULATORY_CARE_PROVIDER_SITE_OTHER): Payer: Managed Care, Other (non HMO) | Admitting: Registered Nurse

## 2021-06-25 VITALS — BP 161/106 | HR 60 | Temp 98.3°F | Resp 18 | Ht 74.0 in | Wt 225.6 lb

## 2021-06-25 DIAGNOSIS — Z13220 Encounter for screening for lipoid disorders: Secondary | ICD-10-CM | POA: Diagnosis not present

## 2021-06-25 DIAGNOSIS — Z1329 Encounter for screening for other suspected endocrine disorder: Secondary | ICD-10-CM | POA: Diagnosis not present

## 2021-06-25 DIAGNOSIS — Z0000 Encounter for general adult medical examination without abnormal findings: Secondary | ICD-10-CM

## 2021-06-25 DIAGNOSIS — Z13228 Encounter for screening for other metabolic disorders: Secondary | ICD-10-CM

## 2021-06-25 DIAGNOSIS — Z130 Encounter for screening for diseases of the blood and blood-forming organs and certain disorders involving the immune mechanism: Secondary | ICD-10-CM

## 2021-06-25 NOTE — Progress Notes (Signed)
New Patient Office Visit  Subjective:  Patient ID: Eric Richardson, male    DOB: [birth year]  Age: 56 y.o. MRN: [id number]  CC:  Chief Complaint  Patient presents with   New Patient (Initial Visit)    Patient states he is here to establish care and CPE.    HPI Eric Richardson presents for new pt to est care, CPE  No acute concerns  Histories reviewed and updated with patient.    Past Medical History:  Diagnosis Date   Spider bite     Past Surgical History:  Procedure Laterality Date   FINGER SURGERY     I and D of spider bite      Family History  Problem Relation Age of Onset   Colon cancer Paternal Uncle    Diabetes Father    Heart disease Father    Colon polyps Neg Hx    Esophageal cancer Neg Hx    Stomach cancer Neg Hx    Rectal cancer Neg Hx     Social History   Socioeconomic History   Marital status: Married    Spouse name: Not on file   Number of children: Not on file   Years of education: Not on file   Highest education level: Not on file  Occupational History   Not on file  Tobacco Use   Smoking status: Never   Smokeless tobacco: Never  Vaping Use   Vaping Use: Never used  Substance and Sexual Activity   Alcohol use: Yes    Alcohol/week: 6.0 standard drinks    Types: 6 Cans of beer per week   Drug use: Never   Sexual activity: Not Currently  Other Topics Concern   Not on file  Social History Narrative   Not on file   Social Determinants of Health   Financial Resource Strain: Not on file  Food Insecurity: Not on file  Transportation Needs: Not on file  Physical Activity: Not on file  Stress: Not on file  Social Connections: Not on file  Intimate Partner Violence: Not on file    ROS Review of Systems  Constitutional: Negative.   HENT: Negative.    Eyes: Negative.   Respiratory: Negative.    Cardiovascular: Negative.   Gastrointestinal: Negative.   Genitourinary: Negative.   Musculoskeletal: Negative.   Skin: Negative.    Neurological: Negative.   Psychiatric/Behavioral: Negative.    All other systems reviewed and are negative.  Objective:   Today's Vitals: BP (!) 161/106    Pulse 60    Temp 98.3 F (36.8 C) (Temporal)    Resp 18    Ht 6\' 2"  (1.88 m)    Wt 225 lb 9.6 oz (102.3 kg)    SpO2 100%    BMI 28.97 kg/m   Physical Exam Vitals and nursing note reviewed.  Constitutional:      General: He is not in acute distress.    Appearance: Normal appearance. He is obese. He is not ill-appearing, toxic-appearing or diaphoretic.  HENT:     Head: Normocephalic and atraumatic.     Right Ear: Tympanic membrane, ear canal and external ear normal. There is no impacted cerumen.     Left Ear: Tympanic membrane, ear canal and external ear normal. There is no impacted cerumen.     Nose: Nose normal. No congestion or rhinorrhea.     Mouth/Throat:     Mouth: Mucous membranes are moist.     Pharynx: Oropharynx is clear. No oropharyngeal  exudate or posterior oropharyngeal erythema.  Eyes:     General: No scleral icterus.       Right eye: No discharge.        Left eye: No discharge.     Extraocular Movements: Extraocular movements intact.     Conjunctiva/sclera: Conjunctivae normal.     Pupils: Pupils are equal, round, and reactive to light.  Neck:     Vascular: No carotid bruit.  Cardiovascular:     Rate and Rhythm: Normal rate and regular rhythm.     Pulses: Normal pulses.     Heart sounds: Normal heart sounds. No murmur heard.   No friction rub. No gallop.  Pulmonary:     Effort: Pulmonary effort is normal. No respiratory distress.     Breath sounds: Normal breath sounds. No stridor. No wheezing, rhonchi or rales.  Chest:     Chest wall: No tenderness.  Abdominal:     General: Abdomen is flat. Bowel sounds are normal. There is no distension.     Palpations: Abdomen is soft. There is no mass.     Tenderness: There is no abdominal tenderness. There is no right CVA tenderness, left CVA tenderness, guarding or  rebound.     Hernia: No hernia is present.  Musculoskeletal:        General: No swelling, tenderness, deformity or signs of injury. Normal range of motion.     Cervical back: Normal range of motion and neck supple. No rigidity or tenderness.     Right lower leg: No edema.     Left lower leg: No edema.  Lymphadenopathy:     Cervical: No cervical adenopathy.  Skin:    General: Skin is warm and dry.     Capillary Refill: Capillary refill takes less than 2 seconds.     Coloration: Skin is not jaundiced or pale.     Findings: No bruising, erythema, lesion or rash.  Neurological:     General: No focal deficit present.     Mental Status: He is alert and oriented to person, place, and time. Mental status is at baseline.     Cranial Nerves: No cranial nerve deficit.     Sensory: No sensory deficit.     Motor: No weakness.     Coordination: Coordination normal.     Gait: Gait normal.     Deep Tendon Reflexes: Reflexes normal.  Psychiatric:        Mood and Affect: Mood normal.        Behavior: Behavior normal.        Thought Content: Thought content normal.        Judgment: Judgment normal.    Assessment & Plan:   Problem List Items Addressed This Visit   None Visit Diagnoses     Annual physical exam    -  Primary   Screening for endocrine, metabolic and immunity disorder       Relevant Orders   CBC with Differential/Platelet (Completed)   Comprehensive metabolic panel (Completed)   Hemoglobin A1c (Completed)   TSH (Completed)   Lipid screening       Relevant Orders   Lipid panel (Completed)       Outpatient Encounter Medications as of 06/25/2021  Medication Sig   [DISCONTINUED] ciprofloxacin (CIPRO) 500 MG tablet Take 1 tablet (500 mg total) by mouth 2 (two) times daily.   [DISCONTINUED] ibuprofen (ADVIL,MOTRIN) 200 MG tablet Take 200 mg by mouth every 6 (six) hours as needed. (Patient not taking:  Reported on 12/07/2019)   No facility-administered encounter medications on  file as of 06/25/2021.    Follow-up: Return in about 2 weeks (around 07/09/2021) for within 2 weeks for lab only visit. Then 1 year for CPE and labs.   PLAN Exam unremarkable Labs collected. Will follow up with the patient as warranted. Return annually, sooner with concerns Patient encouraged to call clinic with any questions, comments, or concerns.  Janeece Agee, NP

## 2021-06-25 NOTE — Patient Instructions (Signed)
Eric Richardson -  Eric Richardson to meet you  No abnormal findings on exam  Labs can be done here or at [facility] - up to you. If you plan to do these here, we ask that you get on to our "Lab Only" schedule to make sure you don't have a wait when you get here. Eric Richardson takes walk ins.  If labs are normal, see you annually for a physical and labs. Sooner if you need anything.  Thank you  Eric Richardson

## 2021-06-27 ENCOUNTER — Other Ambulatory Visit (INDEPENDENT_AMBULATORY_CARE_PROVIDER_SITE_OTHER): Payer: Managed Care, Other (non HMO)

## 2021-06-27 DIAGNOSIS — Z13228 Encounter for screening for other metabolic disorders: Secondary | ICD-10-CM | POA: Diagnosis not present

## 2021-06-27 DIAGNOSIS — Z1329 Encounter for screening for other suspected endocrine disorder: Secondary | ICD-10-CM

## 2021-06-27 DIAGNOSIS — Z13220 Encounter for screening for lipoid disorders: Secondary | ICD-10-CM

## 2021-06-27 DIAGNOSIS — Z130 Encounter for screening for diseases of the blood and blood-forming organs and certain disorders involving the immune mechanism: Secondary | ICD-10-CM

## 2021-06-27 LAB — COMPREHENSIVE METABOLIC PANEL
ALT: 23 U/L (ref 0–53)
AST: 25 U/L (ref 0–37)
Albumin: 4.6 g/dL (ref 3.5–5.2)
Alkaline Phosphatase: 38 U/L — ABNORMAL LOW (ref 39–117)
BUN: 11 mg/dL (ref 6–23)
CO2: 29 mEq/L (ref 19–32)
Calcium: 9.8 mg/dL (ref 8.4–10.5)
Chloride: 102 mEq/L (ref 96–112)
Creatinine, Ser: 1.09 mg/dL (ref 0.40–1.50)
GFR: 76.2 mL/min (ref >60.00)
Glucose, Bld: 102 mg/dL — ABNORMAL HIGH (ref 70–99)
Potassium: 4.9 mEq/L (ref 3.5–5.1)
Sodium: 138 mEq/L (ref 135–145)
Total Bilirubin: 0.9 mg/dL (ref 0.2–1.2)
Total Protein: 7.7 g/dL (ref 6.0–8.3)

## 2021-06-27 LAB — CBC WITH DIFFERENTIAL/PLATELET
Basophils Absolute: 0 10*3/uL (ref 0.0–0.1)
Basophils Relative: 1 % (ref 0.0–3.0)
Eosinophils Absolute: 0.2 10*3/uL (ref 0.0–0.7)
Eosinophils Relative: 3.5 % (ref 0.0–5.0)
HCT: 46.5 % (ref 39.0–52.0)
Hemoglobin: 15.6 g/dL (ref 13.0–17.0)
Lymphocytes Relative: 29.9 % (ref 12.0–46.0)
Lymphs Abs: 1.4 10*3/uL (ref 0.7–4.0)
MCHC: 33.4 g/dL (ref 30.0–36.0)
MCV: 89.1 fl (ref 78.0–100.0)
Monocytes Absolute: 0.4 10*3/uL (ref 0.1–1.0)
Monocytes Relative: 7.7 % (ref 3.0–12.0)
Neutro Abs: 2.8 10*3/uL (ref 1.4–7.7)
Neutrophils Relative %: 57.9 % (ref 43.0–77.0)
Platelets: 199 10*3/uL (ref 150.0–400.0)
RBC: 5.22 Mil/uL (ref 4.22–5.81)
RDW: 13.7 % (ref 11.5–15.5)
WBC: 4.8 10*3/uL (ref 4.0–10.5)

## 2021-06-27 LAB — LIPID PANEL
Cholesterol: 162 mg/dL (ref 0–200)
HDL: 40.2 mg/dL (ref >39.00)
LDL Cholesterol: 105 mg/dL — ABNORMAL HIGH (ref 0–99)
NonHDL: 122.26
Total CHOL/HDL Ratio: 4
Triglycerides: 85 mg/dL (ref 0.0–149.0)
VLDL: 17 mg/dL (ref 0.0–40.0)

## 2021-06-27 LAB — HEMOGLOBIN A1C: Hgb A1c MFr Bld: 5.2 % (ref 4.6–6.5)

## 2021-06-27 LAB — TSH: TSH: 1.48 u[IU]/mL (ref 0.35–5.50)

## 2021-07-02 ENCOUNTER — Encounter: Payer: Self-pay | Admitting: Registered Nurse

## 2021-07-03 ENCOUNTER — Other Ambulatory Visit (INDEPENDENT_AMBULATORY_CARE_PROVIDER_SITE_OTHER): Payer: Managed Care, Other (non HMO)

## 2021-07-03 ENCOUNTER — Other Ambulatory Visit: Payer: Self-pay | Admitting: Registered Nurse

## 2021-07-03 DIAGNOSIS — Z125 Encounter for screening for malignant neoplasm of prostate: Secondary | ICD-10-CM

## 2021-07-03 NOTE — Telephone Encounter (Signed)
Future order placed. My apologies for oversight  Rich

## 2021-07-04 LAB — PSA: PSA: 0.83 ng/mL (ref 0.10–4.00)

## 2021-09-18 ENCOUNTER — Encounter: Payer: Self-pay | Admitting: Registered Nurse

## 2021-09-20 ENCOUNTER — Other Ambulatory Visit: Payer: Self-pay

## 2021-09-20 DIAGNOSIS — H01139 Eczematous dermatitis of unspecified eye, unspecified eyelid: Secondary | ICD-10-CM

## 2021-09-20 NOTE — Telephone Encounter (Signed)
Referral for Dermatology has been entered. ?

## 2022-01-25 ENCOUNTER — Encounter: Payer: Self-pay | Admitting: Family Medicine

## 2022-01-25 ENCOUNTER — Ambulatory Visit (INDEPENDENT_AMBULATORY_CARE_PROVIDER_SITE_OTHER): Payer: Managed Care, Other (non HMO) | Admitting: Family Medicine

## 2022-01-25 DIAGNOSIS — E785 Hyperlipidemia, unspecified: Secondary | ICD-10-CM

## 2022-01-25 DIAGNOSIS — I10 Essential (primary) hypertension: Secondary | ICD-10-CM | POA: Diagnosis not present

## 2022-01-25 NOTE — Patient Instructions (Signed)
Watch your BP.  Follow up in 6 months.  Take care  Dr. Adriana Simas

## 2022-01-28 DIAGNOSIS — I10 Essential (primary) hypertension: Secondary | ICD-10-CM | POA: Insufficient documentation

## 2022-01-28 NOTE — Assessment & Plan Note (Signed)
Mildly elevated LDL.  We will continue to monitor closely.

## 2022-01-28 NOTE — Assessment & Plan Note (Signed)
Uncontrolled.  Discussed pharmacotherapy.  Patient wants to watch diet carefully and lose weight.  He states that when his weight is down his blood pressure issues resolved.

## 2022-01-28 NOTE — Progress Notes (Signed)
Subjective:  Patient ID: Eric Richardson, male    DOB: April 30, 1966  Age: 56 y.o. MRN: [id number]  CC: Chief Complaint  Patient presents with   Establish Care    Former patient of Dr.Morrow.     HPI:  56 year old male presents to establish care with me.  Patient has hyperlipidemia and hypertension.  Hypertension is uncontrolled.  Patient is not interested in pharmacotherapy.  Eric Richardson states that when Eric Richardson is religious about his diet and gets his weight down that his hypertension resolves.  Hyperlipidemia has been fairly well controlled.  LDL mildly elevated at 105.  No pharmacotherapy at this time.  Eric Richardson prefers to manage things without the use of medication if possible.  Eric Richardson is feeling well.  Eric Richardson has no complaints or other concerns at this time.  Patient Active Problem List   Diagnosis Date Noted   Essential hypertension 01/28/2022   Hyperlipidemia 06/09/2015    Social Hx   Social History   Socioeconomic History   Marital status: Married    Spouse name: Not on file   Number of children: Not on file   Years of education: Not on file   Highest education level: Not on file  Occupational History   Not on file  Tobacco Use   Smoking status: Never   Smokeless tobacco: Never  Vaping Use   Vaping Use: Never used  Substance and Sexual Activity   Alcohol use: Yes    Alcohol/week: 6.0 standard drinks of alcohol    Types: 6 Cans of beer per week   Drug use: Never   Sexual activity: Not Currently  Other Topics Concern   Not on file  Social History Narrative   Not on file   Social Determinants of Health   Financial Resource Strain: Not on file  Food Insecurity: Not on file  Transportation Needs: Not on file  Physical Activity: Not on file  Stress: Not on file  Social Connections: Not on file    Review of Systems  Respiratory: Negative.    Cardiovascular: Negative.     Objective:  BP (!) 162/87   Pulse 83   Temp (!) 97.5 F (36.4 C)   Ht 6' 0.5" (1.842 m)   Wt 226 lb  6.4 oz (102.7 kg)   SpO2 98%   BMI 30.28 kg/m      01/25/2022    3:02 PM 06/25/2021    4:03 PM 12/07/2019    1:27 PM  BP/Weight  Systolic BP 162 161 160  Diastolic BP 87 106 98  Wt. (Lbs) 226.4 225.6 240  BMI 30.28 kg/m2 28.97 kg/m2 30.81 kg/m2    Physical Exam Vitals and nursing note reviewed.  Constitutional:      General: Eric Richardson is not in acute distress.    Appearance: Normal appearance.  HENT:     Head: Normocephalic and atraumatic.  Eyes:     General:        Right eye: No discharge.        Left eye: No discharge.     Conjunctiva/sclera: Conjunctivae normal.  Cardiovascular:     Rate and Rhythm: Normal rate and regular rhythm.  Pulmonary:     Effort: Pulmonary effort is normal.     Breath sounds: Normal breath sounds. No wheezing, rhonchi or rales.  Neurological:     Mental Status: Eric Richardson is alert.  Psychiatric:        Mood and Affect: Mood normal.        Behavior: Behavior normal.  Lab Results  Component Value Date   WBC 4.8 06/27/2021   HGB 15.6 06/27/2021   HCT 46.5 06/27/2021   PLT 199.0 06/27/2021   GLUCOSE 102 (H) 06/27/2021   CHOL 162 06/27/2021   TRIG 85.0 06/27/2021   HDL 40.20 06/27/2021   LDLCALC 105 (H) 06/27/2021   ALT 23 06/27/2021   AST 25 06/27/2021   NA 138 06/27/2021   K 4.9 06/27/2021   CL 102 06/27/2021   CREATININE 1.09 06/27/2021   BUN 11 06/27/2021   CO2 29 06/27/2021   TSH 1.48 06/27/2021   PSA 0.83 07/03/2021   HGBA1C 5.2 06/27/2021     Assessment & Plan:   Problem List Items Addressed This Visit       Cardiovascular and Mediastinum   Essential hypertension    Uncontrolled.  Discussed pharmacotherapy.  Patient wants to watch diet carefully and lose weight.  Eric Richardson states that when his weight is down his blood pressure issues resolved.        Other   Hyperlipidemia    Mildly elevated LDL.  We will continue to monitor closely.      Follow-up:  Return in about 6 months (around 07/28/2022).  Everlene Other DO [facility]  Family Medicine

## 2022-03-05 ENCOUNTER — Encounter: Payer: Self-pay | Admitting: Family Medicine

## 2022-03-06 NOTE — Telephone Encounter (Signed)
Epic immunization record updated

## 2022-06-26 ENCOUNTER — Encounter: Payer: Managed Care, Other (non HMO) | Admitting: Registered Nurse

## 2022-07-29 ENCOUNTER — Ambulatory Visit: Payer: Managed Care, Other (non HMO) | Admitting: Family Medicine

## 2022-08-15 ENCOUNTER — Encounter: Payer: Self-pay | Admitting: Radiology

## 2022-08-27 ENCOUNTER — Ambulatory Visit: Payer: Managed Care, Other (non HMO) | Admitting: Family Medicine

## 2022-09-02 ENCOUNTER — Telehealth: Payer: Self-pay | Admitting: Family Medicine

## 2022-09-02 NOTE — Telephone Encounter (Signed)
Last labs 06/2021:  Lipid, TSH, HgbA1c, CMP, CBC, PSA

## 2022-09-02 NOTE — Telephone Encounter (Signed)
Needing labs done for appointment on 3/26

## 2022-09-03 ENCOUNTER — Other Ambulatory Visit: Payer: Self-pay

## 2022-09-03 DIAGNOSIS — I10 Essential (primary) hypertension: Secondary | ICD-10-CM

## 2022-09-03 DIAGNOSIS — E785 Hyperlipidemia, unspecified: Secondary | ICD-10-CM

## 2022-09-03 DIAGNOSIS — Z125 Encounter for screening for malignant neoplasm of prostate: Secondary | ICD-10-CM

## 2022-09-03 NOTE — Telephone Encounter (Signed)
Patient made aware per provider notes . 

## 2022-09-03 NOTE — Telephone Encounter (Signed)
Cook, Jayce G, DO     CBC, CMP, Lipid, PSA. Thank you

## 2022-09-05 LAB — COMPREHENSIVE METABOLIC PANEL
ALT: 26 IU/L (ref 0–44)
AST: 25 IU/L (ref 0–40)
Albumin/Globulin Ratio: 1.7 (ref 1.2–2.2)
Albumin: 4.7 g/dL (ref 3.8–4.9)
Alkaline Phosphatase: 48 IU/L (ref 44–121)
BUN/Creatinine Ratio: 7 — ABNORMAL LOW (ref 9–20)
BUN: 7 mg/dL (ref 6–24)
Bilirubin Total: 0.6 mg/dL (ref 0.0–1.2)
CO2: 24 mmol/L (ref 20–29)
Calcium: 9.9 mg/dL (ref 8.7–10.2)
Chloride: 101 mmol/L (ref 96–106)
Creatinine, Ser: 0.99 mg/dL (ref 0.76–1.27)
Globulin, Total: 2.8 g/dL (ref 1.5–4.5)
Glucose: 110 mg/dL — ABNORMAL HIGH (ref 70–99)
Potassium: 4.8 mmol/L (ref 3.5–5.2)
Sodium: 139 mmol/L (ref 134–144)
Total Protein: 7.5 g/dL (ref 6.0–8.5)
eGFR: 89 mL/min/{1.73_m2} (ref >59)

## 2022-09-05 LAB — CBC WITH DIFFERENTIAL/PLATELET
Basophils Absolute: 0.1 10*3/uL (ref 0.0–0.2)
Basos: 1 %
EOS (ABSOLUTE): 0.2 10*3/uL (ref 0.0–0.4)
Eos: 3 %
Hematocrit: 47.5 % (ref 37.5–51.0)
Hemoglobin: 15.7 g/dL (ref 13.0–17.7)
Immature Grans (Abs): 0 10*3/uL (ref 0.0–0.1)
Immature Granulocytes: 0 %
Lymphocytes Absolute: 1.5 10*3/uL (ref 0.7–3.1)
Lymphs: 25 %
MCH: 29.2 pg (ref 26.6–33.0)
MCHC: 33.1 g/dL (ref 31.5–35.7)
MCV: 89 fL (ref 79–97)
Monocytes Absolute: 0.5 10*3/uL (ref 0.1–0.9)
Monocytes: 7 %
Neutrophils Absolute: 4 10*3/uL (ref 1.4–7.0)
Neutrophils: 64 %
Platelets: 241 10*3/uL (ref 150–450)
RBC: 5.37 x10E6/uL (ref 4.14–5.80)
RDW: 12.6 % (ref 11.6–15.4)
WBC: 6.3 10*3/uL (ref 3.4–10.8)

## 2022-09-05 LAB — LIPID PANEL
Chol/HDL Ratio: 4.1 ratio (ref 0.0–5.0)
Cholesterol, Total: 193 mg/dL (ref 100–199)
HDL: 47 mg/dL (ref >39)
LDL Chol Calc (NIH): 130 mg/dL — ABNORMAL HIGH (ref 0–99)
Triglycerides: 87 mg/dL (ref 0–149)
VLDL Cholesterol Cal: 16 mg/dL (ref 5–40)

## 2022-09-05 LAB — PSA: Prostate Specific Ag, Serum: 0.8 ng/mL (ref 0.0–4.0)

## 2022-09-10 ENCOUNTER — Ambulatory Visit (INDEPENDENT_AMBULATORY_CARE_PROVIDER_SITE_OTHER): Payer: Managed Care, Other (non HMO) | Admitting: Family Medicine

## 2022-09-10 ENCOUNTER — Encounter: Payer: Self-pay | Admitting: Family Medicine

## 2022-09-10 DIAGNOSIS — E785 Hyperlipidemia, unspecified: Secondary | ICD-10-CM | POA: Diagnosis not present

## 2022-09-10 DIAGNOSIS — I10 Essential (primary) hypertension: Secondary | ICD-10-CM | POA: Diagnosis not present

## 2022-09-10 NOTE — Assessment & Plan Note (Signed)
Uncontrolled. Patient declines pharmacotherapy.  He will continue with dietary lifestyle changes.

## 2022-09-10 NOTE — Progress Notes (Signed)
Subjective:  Patient ID: Eric Richardson, male    DOB: 1966-03-07  Age: 57 y.o. MRN: [id number]  CC: Chief Complaint  Patient presents with   6 month follow up    HPI:  57 year old male with hypertension and hyperlipidemia presents for follow-up.  Patient states that his most recent blood pressure was 126/82 when he donated blood.  Normal, his blood pressure is elevated here and was also elevated on repeat.  Recommended pharmacotherapy.  Patient declines and wants to continue working on dietary changes and weight loss.  Patient's lipids also uncontrolled and increased from prior.  LDL trended up from 105-130. Patient's ASCVD risk score is below. The 10-year ASCVD risk score (Arnett DK, et al., 2019) is: 10%   Values used to calculate the score:     Age: 70 years     Sex: Male     Is Non-Hispanic African American: No     Diabetic: No     Tobacco smoker: No     Systolic Blood Pressure: Q000111Q mmHg     Is BP treated: No     HDL Cholesterol: 47 mg/dL     Total Cholesterol: 193 mg/dL  Patient would like CT coronary calcium screening given his age and risk factors.  He is willing to pay out-of-pocket.  Patient Active Problem List   Diagnosis Date Noted   Essential hypertension 01/28/2022   Hyperlipidemia 06/09/2015    Social Hx   Social History   Socioeconomic History   Marital status: Married    Spouse name: Not on file   Number of children: Not on file   Years of education: Not on file   Highest education level: Master's degree (e.g., MA, MS, MEng, MEd, MSW, MBA)  Occupational History   Not on file  Tobacco Use   Smoking status: Never   Smokeless tobacco: Never  Vaping Use   Vaping Use: Never used  Substance and Sexual Activity   Alcohol use: Yes    Alcohol/week: 6.0 standard drinks of alcohol    Types: 6 Cans of beer per week   Drug use: Never   Sexual activity: Not Currently  Other Topics Concern   Not on file  Social History Narrative   Not on file   Social  Determinants of Health   Financial Resource Strain: Low Risk  (09/06/2022)   Overall Financial Resource Strain (CARDIA)    Difficulty of Paying Living Expenses: Not hard at all  Food Insecurity: No Food Insecurity (09/06/2022)   Hunger Vital Sign    Worried About Running Out of Food in the Last Year: Never true    Ran Out of Food in the Last Year: Never true  Transportation Needs: No Transportation Needs (09/06/2022)   PRAPARE - Hydrologist (Medical): No    Lack of Transportation (Non-Medical): No  Physical Activity: Sufficiently Active (09/06/2022)   Exercise Vital Sign    Days of Exercise per Week: 4 days    Minutes of Exercise per Session: 60 min  Stress: No Stress Concern Present (09/06/2022)   Axis    Feeling of Stress : Only a little  Social Connections: Unknown (09/06/2022)   Social Connection and Isolation Panel [NHANES]    Frequency of Communication with Friends and Family: More than three times a week    Frequency of Social Gatherings with Friends and Family: More than three times a week    Attends Religious  Services: Patient declined    Active Member of Clubs or Organizations: Yes    Attends Music therapist: More than 4 times per year    Marital Status: Married    Review of Systems  Constitutional: Negative.   Respiratory: Negative.    Cardiovascular: Negative.      Objective:  BP (!) 161/98   Pulse 73   Temp 97.7 F (36.5 C)   Ht 6' 0.5" (1.842 m)   Wt 220 lb (99.8 kg)   SpO2 98%   BMI 29.43 kg/m      09/10/2022    2:25 PM 09/10/2022    1:43 PM 01/25/2022    3:02 PM  BP/Weight  Systolic BP Q000111Q Q000111Q 0000000  Diastolic BP 98 98 87  Wt. (Lbs)  220 226.4  BMI  29.43 kg/m2 30.28 kg/m2    Physical Exam Vitals and nursing note reviewed.  Constitutional:      General: He is not in acute distress.    Appearance: Normal appearance.  HENT:     Head:  Normocephalic and atraumatic.  Eyes:     General:        Right eye: No discharge.        Left eye: No discharge.     Conjunctiva/sclera: Conjunctivae normal.  Cardiovascular:     Rate and Rhythm: Normal rate and regular rhythm.  Pulmonary:     Effort: Pulmonary effort is normal.     Breath sounds: Normal breath sounds. No wheezing, rhonchi or rales.  Neurological:     Mental Status: He is alert.  Psychiatric:        Mood and Affect: Mood normal.        Behavior: Behavior normal.     Lab Results  Component Value Date   WBC 6.3 09/04/2022   HGB 15.7 09/04/2022   HCT 47.5 09/04/2022   PLT 241 09/04/2022   GLUCOSE 110 (H) 09/04/2022   CHOL 193 09/04/2022   TRIG 87 09/04/2022   HDL 47 09/04/2022   LDLCALC 130 (H) 09/04/2022   ALT 26 09/04/2022   AST 25 09/04/2022   NA 139 09/04/2022   K 4.8 09/04/2022   CL 101 09/04/2022   CREATININE 0.99 09/04/2022   BUN 7 09/04/2022   CO2 24 09/04/2022   TSH 1.48 06/27/2021   PSA 0.83 07/03/2021   HGBA1C 5.2 06/27/2021     Assessment & Plan:   Problem List Items Addressed This Visit       Cardiovascular and Mediastinum   Essential hypertension    Uncontrolled. Patient declines pharmacotherapy.  He will continue with dietary lifestyle changes.      Relevant Orders   CT CARDIAC SCORING (SELF PAY ONLY)     Other   Hyperlipidemia    Uncontrolled. Patient declines pharmacotherapy.      Relevant Orders   CT CARDIAC SCORING (SELF PAY ONLY)    Follow-up:  Return in about 6 months (around 03/13/2023).  [place]

## 2022-09-10 NOTE — Assessment & Plan Note (Addendum)
Uncontrolled. Patient declines pharmacotherapy.

## 2022-09-10 NOTE — Patient Instructions (Signed)
Continue dietary and lifestyle changes.  Follow up in 6 months.

## 2022-09-12 ENCOUNTER — Telehealth: Payer: Self-pay | Admitting: Family Medicine

## 2022-09-12 NOTE — Telephone Encounter (Signed)
Nurse-patient was returning a call [phone]

## 2022-09-12 NOTE — Telephone Encounter (Signed)
Spoke with patient he confirmed that CT scan was scheduled.

## 2022-11-01 ENCOUNTER — Ambulatory Visit (HOSPITAL_COMMUNITY)
Admission: RE | Admit: 2022-11-01 | Discharge: 2022-11-01 | Disposition: A | Discharge status: Home / Self Care | Payer: Managed Care, Other (non HMO) | Source: Ambulatory Visit | Attending: Family Medicine | Admitting: Family Medicine

## 2022-11-01 DIAGNOSIS — E785 Hyperlipidemia, unspecified: Secondary | ICD-10-CM | POA: Insufficient documentation

## 2022-11-01 DIAGNOSIS — I10 Essential (primary) hypertension: Secondary | ICD-10-CM | POA: Insufficient documentation

## 2022-11-05 ENCOUNTER — Encounter: Payer: Self-pay | Admitting: Family Medicine

## 2022-11-08 ENCOUNTER — Ambulatory Visit (HOSPITAL_BASED_OUTPATIENT_CLINIC_OR_DEPARTMENT_OTHER): Payer: Managed Care, Other (non HMO)

## 2022-11-08 ENCOUNTER — Ambulatory Visit (HOSPITAL_BASED_OUTPATIENT_CLINIC_OR_DEPARTMENT_OTHER): Payer: Managed Care, Other (non HMO) | Admitting: Orthopaedic Surgery

## 2022-11-08 DIAGNOSIS — S838X1A Sprain of other specified parts of right knee, initial encounter: Secondary | ICD-10-CM | POA: Diagnosis not present

## 2022-11-08 DIAGNOSIS — M25561 Pain in right knee: Secondary | ICD-10-CM

## 2022-11-08 NOTE — Progress Notes (Signed)
Chief Complaint: Right knee pain     History of Present Illness:    Eric Richardson is a 57 y.o. male presents today with acute right knee pain for the last month when he specifically had a direct contact injury where his goldendoodle ran into the knee and immediately felt the pain with a pop.  He subsequently had difficulty bearing weight.  This is worse with exercise.  He is very active and enjoys running triathlons.  He has been experiencing swelling for which he has been taking ibuprofen and restricting his activity.    Surgical History:   None  PMH/PSH/Family History/Social History/Meds/Allergies:    Past Medical History:  Diagnosis Date   Spider bite    Past Surgical History:  Procedure Laterality Date   FINGER SURGERY     I and D of spider bite     Social History   Socioeconomic History   Marital status: Married    Spouse name: Not on file   Number of children: Not on file   Years of education: Not on file   Highest education level: Master's degree (e.g., MA, MS, MEng, MEd, MSW, MBA)  Occupational History   Not on file  Tobacco Use   Smoking status: Never   Smokeless tobacco: Never  Vaping Use   Vaping Use: Never used  Substance and Sexual Activity   Alcohol use: Yes    Alcohol/week: 6.0 standard drinks of alcohol    Types: 6 Cans of beer per week   Drug use: Never   Sexual activity: Not Currently  Other Topics Concern   Not on file  Social History Narrative   Not on file   Social Determinants of Health   Financial Resource Strain: Low Risk  (09/06/2022)   Overall Financial Resource Strain (CARDIA)    Difficulty of Paying Living Expenses: Not hard at all  Food Insecurity: No Food Insecurity (09/06/2022)   Hunger Vital Sign    Worried About Running Out of Food in the Last Year: Never true    Ran Out of Food in the Last Year: Never true  Transportation Needs: No Transportation Needs (09/06/2022)   PRAPARE -  Administrator, Civil Service (Medical): No    Lack of Transportation (Non-Medical): No  Physical Activity: Sufficiently Active (09/06/2022)   Exercise Vital Sign    Days of Exercise per Week: 4 days    Minutes of Exercise per Session: 60 min  Stress: No Stress Concern Present (09/06/2022)   [employer] of Occupational Health - Occupational Stress Questionnaire    Feeling of Stress : Only a little  Social Connections: Unknown (09/06/2022)   Social Connection and Isolation Panel [NHANES]    Frequency of Communication with Friends and Family: More than three times a week    Frequency of Social Gatherings with Friends and Family: More than three times a week    Attends Religious Services: Patient declined    Database administrator or Organizations: Yes    Attends Engineer, structural: More than 4 times per year    Marital Status: Married   Family History  Problem Relation Age of Onset   Colon cancer Paternal Uncle    Diabetes Father    Heart disease Father    Colon polyps Neg Hx  Esophageal cancer Neg Hx    Stomach cancer Neg Hx    Rectal cancer Neg Hx    No Known Allergies No current outpatient medications on file.   No current facility-administered medications for this visit.   No results found.  Review of Systems:   A ROS was performed including pertinent positives and negatives as documented in the HPI.  Physical Exam :   Constitutional: NAD and appears stated age Neurological: Alert and oriented Psych: Appropriate affect and cooperative There were no vitals taken for this visit.   Comprehensive Musculoskeletal Exam:      Musculoskeletal Exam  Gait Normal  Alignment Normal   Right Left  Inspection Normal Normal  Palpation    Tenderness Medial joint line None  Crepitus None None  Effusion Positive Negative  Range of Motion    Extension -3 -3  Flexion 135 135  Strength    Extension 5/5 5/5  Flexion 5/5 5/5  Ligament Exam      Generalized Laxity No No  Lachman Negative Negative   Pivot Shift Negative Negative  Anterior Drawer Negative Negative  Valgus at 0 Negative Negative  Valgus at 20 Negative Negative  Varus at 0 0 0  Varus at 20   0 0  Posterior Drawer at 90 0 0  Vascular/Lymphatic Exam    Edema None None  Venous Stasis Changes No No  Distal Circulation Normal Normal  Neurologic    Light Touch Sensation Intact Intact  Special Tests:      Imaging:   Xray (4 views right knee): Normal   I personally reviewed and interpreted the radiographs.   Assessment:   57 y.o. male with right knee pain consistent with possible acute medial meniscal injury given his inability to bear weight swelling and immediate injury.  At this time I would like to order an MRI and see him back to rule out any type of underlying meniscal root injury.  We did discuss differential diagnosis including possibility of meniscal root injury.  I did discuss that an MRI is the gold standard acutely to diagnosis given his recent injury.  Will plan to proceed with this and follow-up discuss results  Plan :    -Plan for prior right knee and follow-up discuss results     I personally saw and evaluated the patient, and participated in the management and treatment plan.  Huel Cote, MD Attending Physician, Orthopedic Surgery  This document was dictated using Dragon voice recognition software. A reasonable attempt at proof reading has been made to minimize errors.

## 2022-11-18 ENCOUNTER — Ambulatory Visit: Payer: Managed Care, Other (non HMO) | Admitting: Orthopedic Surgery

## 2022-11-22 ENCOUNTER — Ambulatory Visit (INDEPENDENT_AMBULATORY_CARE_PROVIDER_SITE_OTHER): Payer: Managed Care, Other (non HMO) | Admitting: Orthopaedic Surgery

## 2022-11-22 ENCOUNTER — Encounter (HOSPITAL_BASED_OUTPATIENT_CLINIC_OR_DEPARTMENT_OTHER): Payer: Self-pay | Admitting: Orthopaedic Surgery

## 2022-11-22 DIAGNOSIS — M25561 Pain in right knee: Secondary | ICD-10-CM

## 2022-11-25 ENCOUNTER — Telehealth (HOSPITAL_BASED_OUTPATIENT_CLINIC_OR_DEPARTMENT_OTHER): Payer: Managed Care, Other (non HMO) | Admitting: Orthopaedic Surgery

## 2022-11-25 ENCOUNTER — Encounter (HOSPITAL_BASED_OUTPATIENT_CLINIC_OR_DEPARTMENT_OTHER): Payer: Self-pay

## 2022-11-25 ENCOUNTER — Other Ambulatory Visit (HOSPITAL_COMMUNITY): Payer: Self-pay | Admitting: Orthopaedic Surgery

## 2022-11-25 DIAGNOSIS — M25561 Pain in right knee: Secondary | ICD-10-CM

## 2022-11-25 DIAGNOSIS — M1711 Unilateral primary osteoarthritis, right knee: Secondary | ICD-10-CM

## 2022-11-25 MED ORDER — MELOXICAM 15 MG PO TABS: 15.0000 mg | ORAL_TABLET | Freq: Every day | ORAL | 0 refills | Status: DC

## 2022-11-25 NOTE — Progress Notes (Signed)
Appt changed

## 2022-11-25 NOTE — Progress Notes (Signed)
Spoke with Eric Richardson on the phone regarding his right knee MRI results.  Overall there are mild tricompartmental degenerative findings with a small tear at the posterior lateral meniscal root without complete detachment rather fraying.  At this time I believe an initial course of Mobic and physical therapy would help him greatly.  He will reach back out in 1 month if not 90% or greater improvement for discussion of the cortisone injection.

## 2022-12-10 ENCOUNTER — Other Ambulatory Visit (HOSPITAL_BASED_OUTPATIENT_CLINIC_OR_DEPARTMENT_OTHER): Payer: Self-pay | Admitting: Orthopaedic Surgery

## 2022-12-30 NOTE — Therapy (Signed)
OUTPATIENT PHYSICAL THERAPY LOWER EXTREMITY EVALUATION   Patient Name: Eric Richardson MRN: [id number] DOB:November 16, 1965, 57 y.o., male Today's Date: 12/31/2022  END OF SESSION:  PT End of Session - 12/31/22 1102     Visit Number 1    Number of Visits 1    Date for PT Re-Evaluation 12/31/22    Authorization Type [employer] Managed  no auth, no copay 60 visit limit    Authorization - Number of Visits 60    PT Start Time 1110    PT Stop Time 1150    PT Time Calculation (min) 40 min    Activity Tolerance Patient tolerated treatment well    Behavior During Therapy WFL for tasks assessed/performed             Past Medical History:  Diagnosis Date   Spider bite    Past Surgical History:  Procedure Laterality Date   FINGER SURGERY     I and D of spider bite     Patient Active Problem List   Diagnosis Date Noted   Essential hypertension 01/28/2022   Hyperlipidemia 06/09/2015    PCP: Tommie Sams, DO  REFERRING PROVIDER: Huel Cote, MD  REFERRING DIAG: M17.11 (ICD-10-CM) - Primary osteoarthritis of right knee  THERAPY DIAG:  Chronic pain of right knee - Plan: PT plan of care cert/re-cert  Difficulty in walking, not elsewhere classified - Plan: PT plan of care cert/re-cert  Rationale for Evaluation and Treatment: Rehabilitation  ONSET DATE: mid April  SUBJECTIVE:   SUBJECTIVE STATEMENT: "My dog ran full speed into my knee" back in April; is much better overall; x-ray showed showed some degenerative changes and then recent MRI showed medial meniscus tearing. Works out 3 times a week  PERTINENT HISTORY: none PAIN:  Are you having pain? Yes: NPRS scale: 3/10 Pain location: right medial knee joint line Pain description: tender, sore Aggravating factors: standing too long, sitting too long Relieving factors: get moving  PRECAUTIONS: None    WEIGHT BEARING RESTRICTIONS: No  FALLS:  Has patient fallen in last 6 months? No  OCCUPATION: plant work  PLOF:  Independent  PATIENT GOALS: to know what to do to keep knee strong and moving   NEXT MD VISIT: PRN  OBJECTIVE:   Dx: Acute medial meniscal injury of right knee, initial encounter [N82.9F6O (ICD-10-CM)]   DIAGNOSTIC FINDINGS:   CLINICAL DATA:  Pain.   EXAM: RIGHT KNEE - COMPLETE 4 VIEW   COMPARISON:  None Available.   FINDINGS: No evidence of fracture, dislocation, or joint effusion. Mild tricompartmental degenerative changes with small osteophytes.   IMPRESSION: Degenerative changes.  No acute osseous abnormalities.      PATIENT SURVEYS:  FOTO 85  COGNITION: Overall cognitive status: Within functional limits for tasks assessed     SENSATION: WFL  EDEMA:  Mild swelling right knee medial knee joint line  POSTURE: No Significant postural limitations  PALPATION: Tender right knee medial joint line  LOWER EXTREMITY ROM:  Active ROM Right eval Left eval  Hip flexion AAROM  70 (with strap) 72 (with strap)  Hip extension    Hip abduction    Hip adduction    Hip internal rotation    Hip external rotation    Knee flexion 136 143  Knee extension -4 0  Ankle dorsiflexion  Decreased by 30% versus right  Ankle plantarflexion    Ankle inversion    Ankle eversion     (Blank rows = not tested)  LOWER EXTREMITY MMT:  MMT Right eval Left eval  Hip flexion 5 5  Hip extension 5 5  Hip abduction    Hip adduction    Hip internal rotation    Hip external rotation    Knee flexion 5 5  Knee extension 5 5  Ankle dorsiflexion 5 5  Ankle plantarflexion    Ankle inversion    Ankle eversion     (Blank rows = not tested)   TODAY'S TREATMENT:                                                                                                                              DATE: 12/31/22 physical therapy evaluation and HEP instruction    PATIENT EDUCATION:  Education details: Patient educated on exam findings, POC, scope of PT, HEP. Person educated: Patient Education  method: Explanation, Demonstration, and Handouts Education comprehension: verbalized understanding, returned demonstration, verbal cues required, and tactile cues required   HOME EXERCISE PROGRAM: Access Code: [email or web address] URL: [email or web address] Date: 12/31/2022 Prepared by: [facility] - Rehab  Exercises - Quad Setting and Stretching  - 3 x daily - 7 x weekly - 1 sets - 10 reps - 5 sec hold - Supine Hamstring Stretch with Strap  - 3 x daily - 7 x weekly - 1 sets - 5 reps - 30 sec hold  ASSESSMENT:  CLINICAL IMPRESSION: Patient is a 57 y.o. male who was seen today for physical therapy evaluation and treatment for M17.11 (ICD-10-CM) - Primary osteoarthritis of right knee.  Patient demonstrates muscle weakness, reduced ROM, and fascial restrictions which are likely contributing to symptoms of pain and are negatively impacting patient ability to perform ADLs and functional mobility tasks. Patient will benefit from skilled physical therapy services to address these deficits to reduce pain and improve level of function with ADLs and functional mobility tasks. Patient issued a HEP and will be a one time eval and treat only.    OBJECTIVE IMPAIRMENTS: decreased mobility, decreased ROM, hypomobility, increased edema, and pain.   ACTIVITY LIMITATIONS: sitting, standing, and locomotion level  PARTICIPATION LIMITATIONS: community activity, occupation, and exercise for fitness   REHAB POTENTIAL: Good  CLINICAL DECISION MAKING: Stable/uncomplicated  EVALUATION COMPLEXITY: Low   GOALS: Goals reviewed with patient? No  SHORT TERM GOALS: Target date: 12/31/2022 patient will be independent with initial HEP  Baseline: Goal status: INITIAL    PLAN:  PT FREQUENCY: 1x/week  PT DURATION: 1 week  PLANNED INTERVENTIONS: Therapeutic exercises, Therapeutic activity, Neuromuscular re-education, Balance training, Gait training, Patient/Family education, Joint manipulation, Joint  mobilization, Stair training, Orthotic/Fit training, DME instructions, Aquatic Therapy, Dry Needling, Electrical stimulation, Spinal manipulation, Spinal mobilization, Cryotherapy, Moist heat, Compression bandaging, scar mobilization, Splintting, Taping, Traction, Ultrasound, Ionotophoresis 4mg /ml Dexamethasone, and Manual therapy  Plan: one time Eval and treat only     12:00 PM, 12/31/22 Marrell Dicaprio Small Odilon Cass MPT [place] [facility] [place] [phone] [phone]

## 2022-12-31 ENCOUNTER — Other Ambulatory Visit: Payer: Self-pay

## 2022-12-31 ENCOUNTER — Ambulatory Visit (HOSPITAL_COMMUNITY): Payer: Managed Care, Other (non HMO) | Attending: Orthopaedic Surgery

## 2022-12-31 DIAGNOSIS — M1711 Unilateral primary osteoarthritis, right knee: Secondary | ICD-10-CM | POA: Insufficient documentation

## 2022-12-31 DIAGNOSIS — R262 Difficulty in walking, not elsewhere classified: Secondary | ICD-10-CM | POA: Insufficient documentation

## 2022-12-31 DIAGNOSIS — G8929 Other chronic pain: Secondary | ICD-10-CM | POA: Insufficient documentation

## 2022-12-31 DIAGNOSIS — M25561 Pain in right knee: Secondary | ICD-10-CM | POA: Diagnosis present

## 2023-02-10 ENCOUNTER — Ambulatory Visit: Payer: Managed Care, Other (non HMO) | Admitting: Family Medicine

## 2023-02-11 ENCOUNTER — Ambulatory Visit: Payer: Managed Care, Other (non HMO) | Admitting: Family Medicine

## 2023-02-18 ENCOUNTER — Encounter: Payer: Self-pay | Admitting: Family Medicine

## 2023-02-18 ENCOUNTER — Ambulatory Visit: Payer: Managed Care, Other (non HMO) | Admitting: Family Medicine

## 2023-02-18 VITALS — BP 154/96 | Temp 97.9°F | Ht 74.0 in | Wt 219.6 lb

## 2023-02-18 DIAGNOSIS — I10 Essential (primary) hypertension: Secondary | ICD-10-CM

## 2023-02-18 DIAGNOSIS — E785 Hyperlipidemia, unspecified: Secondary | ICD-10-CM

## 2023-02-18 DIAGNOSIS — Z23 Encounter for immunization: Secondary | ICD-10-CM | POA: Diagnosis not present

## 2023-02-18 NOTE — Progress Notes (Signed)
Subjective:  Patient ID: Eric Richardson, male    DOB: September 10, 1965  Age: 57 y.o. MRN: [id number]  CC: Follow up   HPI:  57 year old male with hypertension and hyperlipidemia presents for follow-up.  Blood pressure remains elevated.  Patient states that he is still working on his goal of getting below 200 pounds.  He states that when his weight is well-controlled, his blood pressure issues resolved.  We will discuss dietary and exercise recommendations today.  Will also discuss possibility of medication.  Patient suffers from hyperlipidemia as well.  He is focusing on lifestyle changes.  Has had CT coronary calcium with a score of 0.  Patient denies chest pain or shortness of breath.  Desires his flu vaccine today.  Patient Active Problem List   Diagnosis Date Noted   Essential hypertension 01/28/2022   Hyperlipidemia 06/09/2015    Social Hx   Social History   Socioeconomic History   Marital status: Married    Spouse name: Not on file   Number of children: Not on file   Years of education: Not on file   Highest education level: Master's degree (e.g., MA, MS, MEng, MEd, MSW, MBA)  Occupational History   Not on file  Tobacco Use   Smoking status: Never   Smokeless tobacco: Never  Vaping Use   Vaping status: Never Used  Substance and Sexual Activity   Alcohol use: Yes    Alcohol/week: 6.0 standard drinks of alcohol    Types: 6 Cans of beer per week   Drug use: Never   Sexual activity: Not Currently  Other Topics Concern   Not on file  Social History Narrative   Not on file   Social Determinants of Health   Financial Resource Strain: Low Risk  (09/06/2022)   Overall Financial Resource Strain (CARDIA)    Difficulty of Paying Living Expenses: Not hard at all  Food Insecurity: No Food Insecurity (09/06/2022)   Hunger Vital Sign    Worried About Running Out of Food in the Last Year: Never true    Ran Out of Food in the Last Year: Never true  Transportation Needs: No  Transportation Needs (09/06/2022)   PRAPARE - Administrator, Civil Service (Medical): No    Lack of Transportation (Non-Medical): No  Physical Activity: Sufficiently Active (09/06/2022)   Exercise Vital Sign    Days of Exercise per Week: 4 days    Minutes of Exercise per Session: 60 min  Stress: No Stress Concern Present (09/06/2022)   [employer] of Occupational Health - Occupational Stress Questionnaire    Feeling of Stress : Only a little  Social Connections: Unknown (09/06/2022)   Social Connection and Isolation Panel [NHANES]    Frequency of Communication with Friends and Family: More than three times a week    Frequency of Social Gatherings with Friends and Family: More than three times a week    Attends Religious Services: Patient declined    Database administrator or Organizations: Yes    Attends Engineer, structural: More than 4 times per year    Marital Status: Married    Review of Systems Per HPI  Objective:  BP (!) 154/96   Temp 97.9 F (36.6 C) (Oral)   Ht 6\' 2"  (1.88 m)   Wt 219 lb 9.6 oz (99.6 kg)   SpO2 100%   BMI 28.19 kg/m      02/18/2023   10:11 AM 02/18/2023    9:33 AM  09/10/2022    2:25 PM  BP/Weight  Systolic BP 154 158 161  Diastolic BP 96 90 98  Wt. (Lbs)  219.6   BMI  28.19 kg/m2     Physical Exam Vitals and nursing note reviewed.  Constitutional:      General: He is not in acute distress.    Appearance: Normal appearance.  HENT:     Head: Normocephalic and atraumatic.  Eyes:     General:        Right eye: No discharge.        Left eye: No discharge.     Conjunctiva/sclera: Conjunctivae normal.  Cardiovascular:     Rate and Rhythm: Normal rate and regular rhythm.  Pulmonary:     Effort: Pulmonary effort is normal.     Breath sounds: Normal breath sounds. No wheezing, rhonchi or rales.  Neurological:     Mental Status: He is alert.  Psychiatric:        Mood and Affect: Mood normal.        Behavior: Behavior  normal.     Lab Results  Component Value Date   WBC 6.3 09/04/2022   HGB 15.7 09/04/2022   HCT 47.5 09/04/2022   PLT 241 09/04/2022   GLUCOSE 110 (H) 09/04/2022   CHOL 193 09/04/2022   TRIG 87 09/04/2022   HDL 47 09/04/2022   LDLCALC 130 (H) 09/04/2022   ALT 26 09/04/2022   AST 25 09/04/2022   NA 139 09/04/2022   K 4.8 09/04/2022   CL 101 09/04/2022   CREATININE 0.99 09/04/2022   BUN 7 09/04/2022   CO2 24 09/04/2022   TSH 1.48 06/27/2021   PSA 0.83 07/03/2021   HGBA1C 5.2 06/27/2021     Assessment & Plan:   Problem List Items Addressed This Visit       Cardiovascular and Mediastinum   Essential hypertension - Primary    Uncontrolled.  Patient declines medication.  He wants to continue with dietary and lifestyle changes.          Other   Hyperlipidemia    Patient declines medication.  Continue with dietary and lifestyle changes.      Other Visit Diagnoses     Needs flu shot       Relevant Orders   Flu vaccine trivalent PF, 6mos and older(Flulaval,Afluria,Fluarix,Fluzone) (Completed)       Follow-up:  Return in about 6 months (around 08/18/2023).  Everlene Other DO [facility]

## 2023-02-18 NOTE — Assessment & Plan Note (Signed)
Patient declines medication.  Continue with dietary and lifestyle changes.

## 2023-02-18 NOTE — Assessment & Plan Note (Signed)
Uncontrolled.  Patient declines medication.  He wants to continue with dietary and lifestyle changes.

## 2023-02-18 NOTE — Patient Instructions (Signed)
Keep working!  Follow up in 6 months.  Take car  Dr. Adriana Simas

## 2023-04-14 ENCOUNTER — Ambulatory Visit: Payer: Managed Care, Other (non HMO) | Admitting: Family Medicine

## 2023-08-18 ENCOUNTER — Ambulatory Visit: Payer: Managed Care, Other (non HMO) | Admitting: Family Medicine
